# Patient Record
Sex: Female | Born: 1935 | Race: White | Hispanic: No | Marital: Married | Smoking: Never smoker
Health system: Southern US, Community
[De-identification: ages and names within clinical notes are randomized; demographics above are authoritative.]

## PROBLEM LIST (undated history)

## (undated) DIAGNOSIS — T7840XA Allergy, unspecified, initial encounter: Secondary | ICD-10-CM

## (undated) DIAGNOSIS — C50919 Malignant neoplasm of unspecified site of unspecified female breast: Secondary | ICD-10-CM

## (undated) DIAGNOSIS — I10 Essential (primary) hypertension: Secondary | ICD-10-CM

## (undated) DIAGNOSIS — I519 Heart disease, unspecified: Secondary | ICD-10-CM

## (undated) DIAGNOSIS — M1990 Unspecified osteoarthritis, unspecified site: Secondary | ICD-10-CM

## (undated) DIAGNOSIS — C801 Malignant (primary) neoplasm, unspecified: Secondary | ICD-10-CM

## (undated) DIAGNOSIS — R011 Cardiac murmur, unspecified: Secondary | ICD-10-CM

## (undated) DIAGNOSIS — M719 Bursopathy, unspecified: Secondary | ICD-10-CM

## (undated) HISTORY — PX: UPPER GI ENDOSCOPY: SHX6162

## (undated) HISTORY — DX: Heart disease, unspecified: I51.9

## (undated) HISTORY — DX: Essential (primary) hypertension: I10

## (undated) HISTORY — DX: Malignant (primary) neoplasm, unspecified: C80.1

## (undated) HISTORY — DX: Bursopathy, unspecified: M71.9

## (undated) HISTORY — DX: Allergy, unspecified, initial encounter: T78.40XA

## (undated) HISTORY — DX: Unspecified osteoarthritis, unspecified site: M19.90

## (undated) HISTORY — DX: Cardiac murmur, unspecified: R01.1

## (undated) HISTORY — PX: ESOPHAGEAL DILATION: SHX303

## (undated) HISTORY — DX: Malignant neoplasm of unspecified site of unspecified female breast: C50.919

---
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
## 1981-06-09 HISTORY — PX: ABDOMINAL HYSTERECTOMY: SHX81

## 2000-06-09 HISTORY — PX: BRONCHOSCOPY: SUR163

## 2002-06-09 HISTORY — PX: COLONOSCOPY: SHX174

## 2004-03-12 ENCOUNTER — Ambulatory Visit: Payer: Self-pay | Admitting: Otolaryngology

## 2004-06-09 DIAGNOSIS — I10 Essential (primary) hypertension: Secondary | ICD-10-CM

## 2004-06-09 HISTORY — DX: Essential (primary) hypertension: I10

## 2005-04-25 ENCOUNTER — Ambulatory Visit: Payer: Self-pay | Admitting: Internal Medicine

## 2006-05-13 ENCOUNTER — Ambulatory Visit: Payer: Self-pay | Admitting: Internal Medicine

## 2007-01-05 ENCOUNTER — Ambulatory Visit: Payer: Self-pay | Admitting: Otolaryngology

## 2007-03-11 ENCOUNTER — Ambulatory Visit: Payer: Self-pay | Admitting: Internal Medicine

## 2007-05-17 ENCOUNTER — Ambulatory Visit: Payer: Self-pay | Admitting: Internal Medicine

## 2007-10-28 ENCOUNTER — Ambulatory Visit: Payer: Self-pay | Admitting: Otolaryngology

## 2008-05-18 ENCOUNTER — Ambulatory Visit: Payer: Self-pay | Admitting: Internal Medicine

## 2009-05-22 ENCOUNTER — Ambulatory Visit: Payer: Self-pay | Admitting: Internal Medicine

## 2009-05-24 ENCOUNTER — Ambulatory Visit: Payer: Self-pay | Admitting: Internal Medicine

## 2009-06-09 HISTORY — PX: PORT A CATH REVISION: SHX6033

## 2009-06-14 ENCOUNTER — Ambulatory Visit: Payer: Self-pay | Admitting: General Surgery

## 2009-06-19 ENCOUNTER — Ambulatory Visit: Payer: Self-pay | Admitting: General Surgery

## 2009-06-21 ENCOUNTER — Ambulatory Visit: Payer: Self-pay | Admitting: General Surgery

## 2009-06-21 DIAGNOSIS — C50919 Malignant neoplasm of unspecified site of unspecified female breast: Secondary | ICD-10-CM

## 2009-06-21 HISTORY — PX: BREAST SURGERY: SHX581

## 2009-06-21 HISTORY — DX: Malignant neoplasm of unspecified site of unspecified female breast: C50.919

## 2009-07-10 ENCOUNTER — Ambulatory Visit: Payer: Self-pay | Admitting: Oncology

## 2009-07-13 ENCOUNTER — Ambulatory Visit: Payer: Self-pay | Admitting: Oncology

## 2009-07-19 ENCOUNTER — Ambulatory Visit: Payer: Self-pay | Admitting: General Surgery

## 2009-08-02 ENCOUNTER — Inpatient Hospital Stay: Payer: Self-pay | Admitting: Oncology

## 2009-08-07 ENCOUNTER — Ambulatory Visit: Payer: Self-pay | Admitting: Oncology

## 2009-09-07 ENCOUNTER — Ambulatory Visit: Payer: Self-pay | Admitting: Oncology

## 2009-10-07 ENCOUNTER — Ambulatory Visit: Payer: Self-pay | Admitting: Oncology

## 2009-11-07 ENCOUNTER — Ambulatory Visit: Payer: Self-pay | Admitting: Oncology

## 2009-12-07 ENCOUNTER — Ambulatory Visit: Payer: Self-pay | Admitting: Oncology

## 2010-01-07 ENCOUNTER — Ambulatory Visit: Payer: Self-pay | Admitting: Oncology

## 2010-02-14 ENCOUNTER — Ambulatory Visit: Payer: Self-pay | Admitting: Oncology

## 2010-03-09 ENCOUNTER — Ambulatory Visit: Payer: Self-pay | Admitting: Oncology

## 2010-03-29 LAB — CANCER ANTIGEN 27.29: CA 27.29: 26.9 U/mL (ref 0.0–38.6)

## 2010-04-09 ENCOUNTER — Ambulatory Visit: Payer: Self-pay | Admitting: Oncology

## 2010-05-02 ENCOUNTER — Emergency Department: Payer: Self-pay | Admitting: Emergency Medicine

## 2010-05-09 ENCOUNTER — Ambulatory Visit: Payer: Self-pay | Admitting: Oncology

## 2010-06-09 ENCOUNTER — Ambulatory Visit: Payer: Self-pay | Admitting: Oncology

## 2010-06-09 HISTORY — PX: PORT-A-CATH REMOVAL: SHX5289

## 2010-06-09 HISTORY — PX: TRACHEAL SURGERY: SHX1096

## 2010-06-25 ENCOUNTER — Ambulatory Visit: Payer: Self-pay | Admitting: General Surgery

## 2010-07-05 LAB — CANCER ANTIGEN 27.29: CA 27.29: 28.1 U/mL (ref 0.0–38.6)

## 2010-07-10 ENCOUNTER — Ambulatory Visit: Payer: Self-pay | Admitting: Oncology

## 2010-10-25 ENCOUNTER — Ambulatory Visit: Payer: Self-pay | Admitting: Oncology

## 2010-11-08 ENCOUNTER — Ambulatory Visit: Payer: Self-pay | Admitting: Oncology

## 2011-05-06 ENCOUNTER — Ambulatory Visit: Payer: Self-pay | Admitting: Oncology

## 2011-05-10 ENCOUNTER — Ambulatory Visit: Payer: Self-pay | Admitting: Oncology

## 2011-07-01 ENCOUNTER — Ambulatory Visit: Payer: Self-pay | Admitting: General Surgery

## 2011-11-05 ENCOUNTER — Ambulatory Visit: Payer: Self-pay | Admitting: Oncology

## 2011-11-05 LAB — CBC CANCER CENTER
Eosinophil #: 0.1 x10 3/mm (ref 0.0–0.7)
Eosinophil %: 1 %
HGB: 14.5 g/dL (ref 12.0–16.0)
Lymphocyte %: 35.8 %
MCV: 87 fL (ref 80–100)
Monocyte #: 0.5 x10 3/mm (ref 0.2–0.9)
Monocyte %: 7.7 %
Neutrophil #: 3.6 x10 3/mm (ref 1.4–6.5)
Neutrophil %: 54.7 %
Platelet: 174 x10 3/mm (ref 150–440)
RBC: 5.05 10*6/uL (ref 3.80–5.20)
RDW: 14.5 % (ref 11.5–14.5)
WBC: 6.5 x10 3/mm (ref 3.6–11.0)

## 2011-11-05 LAB — COMPREHENSIVE METABOLIC PANEL
Alkaline Phosphatase: 100 U/L (ref 50–136)
Anion Gap: 8 (ref 7–16)
BUN: 18 mg/dL (ref 7–18)
Bilirubin,Total: 0.5 mg/dL (ref 0.2–1.0)
Calcium, Total: 9.4 mg/dL (ref 8.5–10.1)
EGFR (African American): >60
Osmolality: 280 (ref 275–301)
Potassium: 4.2 mmol/L (ref 3.5–5.1)
SGPT (ALT): 28 U/L
Sodium: 139 mmol/L (ref 136–145)

## 2011-11-08 ENCOUNTER — Ambulatory Visit: Payer: Self-pay | Admitting: Oncology

## 2012-05-12 ENCOUNTER — Ambulatory Visit: Payer: Self-pay | Admitting: Oncology

## 2012-05-12 LAB — COMPREHENSIVE METABOLIC PANEL
Albumin: 3.4 g/dL (ref 3.4–5.0)
Alkaline Phosphatase: 95 U/L (ref 50–136)
BUN: 18 mg/dL (ref 7–18)
Calcium, Total: 9.1 mg/dL (ref 8.5–10.1)
EGFR (Non-African Amer.): 52 — ABNORMAL LOW
Glucose: 96 mg/dL (ref 65–99)
Osmolality: 277 (ref 275–301)
SGOT(AST): 19 U/L (ref 15–37)
SGPT (ALT): 22 U/L (ref 12–78)
Sodium: 138 mmol/L (ref 136–145)
Total Protein: 7.2 g/dL (ref 6.4–8.2)

## 2012-05-12 LAB — CBC CANCER CENTER
Basophil %: 0.2 %
Eosinophil %: 1.2 %
HGB: 15.4 g/dL (ref 12.0–16.0)
MCH: 29.2 pg (ref 26.0–34.0)
MCHC: 33.6 g/dL (ref 32.0–36.0)
Monocyte %: 8.2 %
Neutrophil %: 56.2 %
Platelet: 170 x10 3/mm (ref 150–440)
WBC: 6.1 x10 3/mm (ref 3.6–11.0)

## 2012-05-13 LAB — CANCER ANTIGEN 27.29: CA 27.29: 29.1 U/mL (ref 0.0–38.6)

## 2012-06-09 ENCOUNTER — Ambulatory Visit: Payer: Self-pay | Admitting: Oncology

## 2012-07-01 ENCOUNTER — Ambulatory Visit: Payer: Self-pay | Admitting: General Surgery

## 2012-11-10 ENCOUNTER — Ambulatory Visit: Payer: Self-pay | Admitting: Oncology

## 2012-12-07 ENCOUNTER — Encounter: Payer: Self-pay | Admitting: *Deleted

## 2012-12-07 ENCOUNTER — Ambulatory Visit: Payer: Self-pay | Admitting: Oncology

## 2013-05-11 ENCOUNTER — Ambulatory Visit: Payer: Self-pay | Admitting: Oncology

## 2013-05-11 LAB — CBC CANCER CENTER
Basophil %: 1.1 %
Eosinophil %: 0.8 %
HCT: 43.2 % (ref 35.0–47.0)
HGB: 14.3 g/dL (ref 12.0–16.0)
Lymphocyte #: 3.2 x10 3/mm (ref 1.0–3.6)
Lymphocyte %: 41.6 %
MCH: 29 pg (ref 26.0–34.0)
MCHC: 33.1 g/dL (ref 32.0–36.0)
Monocyte %: 4 %
Neutrophil #: 4 x10 3/mm (ref 1.4–6.5)
Neutrophil %: 52.5 %
Platelet: 171 x10 3/mm (ref 150–440)
RBC: 4.94 10*6/uL (ref 3.80–5.20)
RDW: 14.1 % (ref 11.5–14.5)
WBC: 7.7 x10 3/mm (ref 3.6–11.0)

## 2013-05-11 LAB — COMPREHENSIVE METABOLIC PANEL
Albumin: 3.4 g/dL (ref 3.4–5.0)
BUN: 18 mg/dL (ref 7–18)
EGFR (African American): >60
EGFR (Non-African Amer.): 54 — ABNORMAL LOW
Glucose: 134 mg/dL — ABNORMAL HIGH (ref 65–99)
SGOT(AST): 20 U/L (ref 15–37)
SGPT (ALT): 19 U/L (ref 12–78)
Total Protein: 7.2 g/dL (ref 6.4–8.2)

## 2013-05-12 LAB — CANCER ANTIGEN 27.29: CA 27.29: 38.8 U/mL — ABNORMAL HIGH (ref 0.0–38.6)

## 2013-06-09 ENCOUNTER — Ambulatory Visit: Payer: Self-pay | Admitting: Oncology

## 2013-07-11 ENCOUNTER — Ambulatory Visit: Payer: Self-pay | Admitting: General Surgery

## 2013-07-12 ENCOUNTER — Encounter: Payer: Self-pay | Admitting: General Surgery

## 2013-07-20 ENCOUNTER — Encounter: Payer: Self-pay | Admitting: General Surgery

## 2013-07-20 ENCOUNTER — Ambulatory Visit (INDEPENDENT_AMBULATORY_CARE_PROVIDER_SITE_OTHER): Payer: Medicare Other | Admitting: General Surgery

## 2013-07-20 VITALS — BP 142/66 | HR 68 | Resp 14 | Ht 64.0 in | Wt 130.0 lb

## 2013-07-20 DIAGNOSIS — C50919 Malignant neoplasm of unspecified site of unspecified female breast: Secondary | ICD-10-CM

## 2013-07-20 NOTE — Patient Instructions (Signed)
Continue to do self breast exams. Call for any new breast issues or concerns. 

## 2013-07-20 NOTE — Progress Notes (Addendum)
Patient ID: Diana Ellis, female   DOB: 1934-05-30, 80 y.o.   MRN: [id number]  Chief Complaint  Patient presents with  . Follow-up    history of breast cancer    HPI Diana Ellis is a 80 y.o. female.  who presents for her annual follow up and breast evaluation. She has a known history of left breast cancer 2011.The most recent mammogram was done on 07-13-13.  Patient does perform regular self breast checks and gets regular mammograms done.  No new breast issues, tolerating Tamoxifen she does admit to "hot flashes".  HPI  Past Medical History  Diagnosis Date  . Allergy   . Hypertension 2006  . Arthritis   . Bursitis   . Cardiac disease   . Heart murmur   . Cancer   . Malignant neoplasm of breast (female), unspecified site June 21, 2009    T2, N1a, M0; ER 25%, PR 50%, HER-2/neu not over expressed. Mammoprint: High risk for recurrent.    Past Surgical History  Procedure Laterality Date  . Colonoscopy  2004    Dr Vira Agar  . Tracheal surgery  2012  . Port a cath revision  2011  . Upper gi endoscopy    . Abdominal hysterectomy  1983  . Bronchoscopy  2002  . Port-a-cath removal  2012  . Tonsillectomy  1957  . Breast surgery Left June 21, 2009    MASTECTOMY,, sentinel node biopsy    Family History  Problem Relation Age of Onset  . Cancer Mother     lung    Social History History  Substance Use Topics  . Smoking status: Never Smoker   . Smokeless tobacco: Never Used  . Alcohol Use: No    Allergies  Allergen Reactions  . Codeine Nausea Only    Current Outpatient Prescriptions  Medication Sig Dispense Refill  . acetaminophen (TYLENOL ARTHRITIS PAIN) 650 MG CR tablet Take 650 mg by mouth 2 (two) times daily.      Marland Kitchen aspirin 325 MG tablet Take 325 mg by mouth daily. 1/2 tablet daily      . calcium carbonate (OS-CAL) 600 MG TABS tablet Take 600 mg by mouth daily with breakfast.      . gabapentin (NEURONTIN) 300 MG capsule Take 300 mg by mouth 3 (three) times daily.       . Glucosamine-Chondroit-Vit C-Mn (GLUCOSAMINE 1500 COMPLEX) CAPS Take by mouth daily.      Marland Kitchen KRILL OIL PO Take by mouth daily.      Marland Kitchen omeprazole (PRILOSEC) 20 MG capsule Take 20 mg by mouth daily.      . tamoxifen (NOLVADEX) 20 MG tablet Take 20 mg by mouth daily.      Marland Kitchen triamterene-hydrochlorothiazide (MAXZIDE-25) 37.5-25 MG per tablet Take 1 tablet by mouth daily.       No current facility-administered medications for this visit.    Review of Systems Review of Systems  Constitutional: Negative.   Respiratory: Negative.   Cardiovascular: Negative.     Blood pressure 142/66, pulse 68, resp. rate 14, height 5' 4"  (1.626 m), weight 130 lb (58.968 kg).  Physical Exam Physical Exam  Constitutional: She is oriented to person, place, and time. She appears well-developed and well-nourished.  Neck: Neck supple.  Cardiovascular: Normal rate, regular rhythm and normal heart sounds.   No lower leg edema noted  Pulmonary/Chest: Effort normal and breath sounds normal. Right breast exhibits no inverted nipple, no mass, no nipple discharge, no skin change and no tenderness.  Left chest wall with no abnormalities   Lymphadenopathy:    She has no cervical adenopathy.    She has no axillary adenopathy.  Neurological: She is alert and oriented to person, place, and time.  Skin: Skin is warm and dry.    Data Reviewed Mammogram dated July 11, 2013 of the right breast was unremarkable. BI-RAD-1.  Assessment    Doing well status post left mastectomy sentinel node biopsy for invasive lobular carcinoma, status post adjuvant chemotherapy.     Plan    Will plan for a follow up examination in one year with a right screening mammogram.   A prescription for a new left breast prosthesis and mastectomy bras x 6 was provided.       Robert Bellow 07/21/2013, 9:15 PM

## 2013-07-21 ENCOUNTER — Encounter: Payer: Self-pay | Admitting: General Surgery

## 2013-07-21 DIAGNOSIS — C50919 Malignant neoplasm of unspecified site of unspecified female breast: Secondary | ICD-10-CM | POA: Insufficient documentation

## 2013-08-03 ENCOUNTER — Encounter: Payer: Self-pay | Admitting: General Surgery

## 2013-09-19 ENCOUNTER — Ambulatory Visit: Payer: Self-pay | Admitting: Anesthesiology

## 2013-09-19 LAB — POTASSIUM: Potassium: 3.7 mmol/L (ref 3.5–5.1)

## 2013-10-18 ENCOUNTER — Ambulatory Visit: Payer: Self-pay | Admitting: Obstetrics and Gynecology

## 2013-10-18 LAB — HEMOGLOBIN: HGB: 14 g/dL (ref 12.0–16.0)

## 2013-10-18 LAB — BASIC METABOLIC PANEL
Anion Gap: 4 — ABNORMAL LOW (ref 7–16)
BUN: 17 mg/dL (ref 7–18)
CALCIUM: 8.9 mg/dL (ref 8.5–10.1)
Chloride: 105 mmol/L (ref 98–107)
Co2: 30 mmol/L (ref 21–32)
Creatinine: 0.86 mg/dL (ref 0.60–1.30)
EGFR (African American): >60
Glucose: 98 mg/dL (ref 65–99)
Osmolality: 279 (ref 275–301)
POTASSIUM: 3.5 mmol/L (ref 3.5–5.1)
Sodium: 139 mmol/L (ref 136–145)

## 2013-10-24 ENCOUNTER — Ambulatory Visit: Payer: Self-pay | Admitting: Obstetrics and Gynecology

## 2013-10-25 LAB — BASIC METABOLIC PANEL
Anion Gap: 5 — ABNORMAL LOW (ref 7–16)
BUN: 10 mg/dL (ref 7–18)
CALCIUM: 8.2 mg/dL — AB (ref 8.5–10.1)
Chloride: 102 mmol/L (ref 98–107)
Co2: 31 mmol/L (ref 21–32)
Creatinine: 0.88 mg/dL (ref 0.60–1.30)
EGFR (African American): >60
EGFR (Non-African Amer.): >60
Glucose: 102 mg/dL — ABNORMAL HIGH (ref 65–99)
OSMOLALITY: 275 (ref 275–301)
POTASSIUM: 3.3 mmol/L — AB (ref 3.5–5.1)
Sodium: 138 mmol/L (ref 136–145)

## 2013-10-25 LAB — HEMATOCRIT: HCT: 37 % (ref 35.0–47.0)

## 2013-11-15 ENCOUNTER — Ambulatory Visit: Payer: Self-pay | Admitting: Oncology

## 2013-11-15 LAB — COMPREHENSIVE METABOLIC PANEL
ALBUMIN: 3 g/dL — AB (ref 3.4–5.0)
ALT: 14 U/L (ref 12–78)
ANION GAP: 8 (ref 7–16)
Alkaline Phosphatase: 76 U/L
BUN: 19 mg/dL — ABNORMAL HIGH (ref 7–18)
Bilirubin,Total: 0.3 mg/dL (ref 0.2–1.0)
CHLORIDE: 101 mmol/L (ref 98–107)
CO2: 29 mmol/L (ref 21–32)
CREATININE: 1 mg/dL (ref 0.60–1.30)
Calcium, Total: 8.8 mg/dL (ref 8.5–10.1)
EGFR (African American): >60
GFR CALC NON AF AMER: 54 — AB
Glucose: 121 mg/dL — ABNORMAL HIGH (ref 65–99)
Osmolality: 279 (ref 275–301)
POTASSIUM: 3.4 mmol/L — AB (ref 3.5–5.1)
SGOT(AST): 18 U/L (ref 15–37)
SODIUM: 138 mmol/L (ref 136–145)
TOTAL PROTEIN: 6.8 g/dL (ref 6.4–8.2)

## 2013-11-15 LAB — CBC CANCER CENTER
BASOS PCT: 1.2 %
Basophil #: 0.1 x10 3/mm (ref 0.0–0.1)
Eosinophil #: 0.2 x10 3/mm (ref 0.0–0.7)
Eosinophil %: 2.7 %
HCT: 38.7 % (ref 35.0–47.0)
HGB: 13 g/dL (ref 12.0–16.0)
LYMPHS PCT: 34.8 %
Lymphocyte #: 2.6 x10 3/mm (ref 1.0–3.6)
MCH: 28.9 pg (ref 26.0–34.0)
MCHC: 33.6 g/dL (ref 32.0–36.0)
MCV: 86 fL (ref 80–100)
Monocyte #: 0.4 x10 3/mm (ref 0.2–0.9)
Monocyte %: 5.6 %
Neutrophil #: 4.2 x10 3/mm (ref 1.4–6.5)
Neutrophil %: 55.7 %
Platelet: 181 x10 3/mm (ref 150–440)
RBC: 4.5 10*6/uL (ref 3.80–5.20)
RDW: 14.8 % — ABNORMAL HIGH (ref 11.5–14.5)
WBC: 7.5 x10 3/mm (ref 3.6–11.0)

## 2013-12-07 ENCOUNTER — Ambulatory Visit: Payer: Self-pay | Admitting: Oncology

## 2014-04-10 ENCOUNTER — Encounter: Payer: Self-pay | Admitting: General Surgery

## 2014-05-17 ENCOUNTER — Ambulatory Visit: Payer: Self-pay | Admitting: Oncology

## 2014-05-17 LAB — CBC CANCER CENTER
BASOS ABS: 0.1 x10 3/mm (ref 0.0–0.1)
BASOS PCT: 1.1 %
EOS ABS: 0.1 x10 3/mm (ref 0.0–0.7)
EOS PCT: 0.9 %
HCT: 39.2 % (ref 35.0–47.0)
HGB: 12.9 g/dL (ref 12.0–16.0)
LYMPHS PCT: 48.4 %
Lymphocyte #: 3.4 x10 3/mm (ref 1.0–3.6)
MCH: 28.1 pg (ref 26.0–34.0)
MCHC: 32.9 g/dL (ref 32.0–36.0)
MCV: 85 fL (ref 80–100)
MONOS PCT: 6.1 %
Monocyte #: 0.4 x10 3/mm (ref 0.2–0.9)
NEUTROS PCT: 43.5 %
Neutrophil #: 3 x10 3/mm (ref 1.4–6.5)
Platelet: 203 x10 3/mm (ref 150–440)
RBC: 4.59 10*6/uL (ref 3.80–5.20)
RDW: 15.3 % — ABNORMAL HIGH (ref 11.5–14.5)
WBC: 7 x10 3/mm (ref 3.6–11.0)

## 2014-05-17 LAB — COMPREHENSIVE METABOLIC PANEL
ALBUMIN: 3.1 g/dL — AB (ref 3.4–5.0)
ALK PHOS: 88 U/L
ANION GAP: 8 (ref 7–16)
AST: 32 U/L (ref 15–37)
BILIRUBIN TOTAL: 0.3 mg/dL (ref 0.2–1.0)
BUN: 21 mg/dL — ABNORMAL HIGH (ref 7–18)
CHLORIDE: 101 mmol/L (ref 98–107)
CREATININE: 1.19 mg/dL (ref 0.60–1.30)
Calcium, Total: 8.8 mg/dL (ref 8.5–10.1)
Co2: 28 mmol/L (ref 21–32)
EGFR (Non-African Amer.): 47 — ABNORMAL LOW
GFR CALC AF AMER: 57 — AB
Glucose: 149 mg/dL — ABNORMAL HIGH (ref 65–99)
OSMOLALITY: 280 (ref 275–301)
Potassium: 3.6 mmol/L (ref 3.5–5.1)
SGPT (ALT): 15 U/L
Sodium: 137 mmol/L (ref 136–145)
Total Protein: 7 g/dL (ref 6.4–8.2)

## 2014-06-09 ENCOUNTER — Ambulatory Visit: Payer: Self-pay | Admitting: Oncology

## 2014-07-12 ENCOUNTER — Encounter: Payer: Self-pay | Admitting: General Surgery

## 2014-07-12 ENCOUNTER — Ambulatory Visit: Payer: Self-pay | Admitting: General Surgery

## 2014-07-20 ENCOUNTER — Ambulatory Visit: Payer: PRIVATE HEALTH INSURANCE | Admitting: General Surgery

## 2014-07-28 ENCOUNTER — Ambulatory Visit: Payer: Self-pay | Admitting: Unknown Physician Specialty

## 2014-08-01 ENCOUNTER — Encounter: Payer: Self-pay | Admitting: General Surgery

## 2014-08-01 ENCOUNTER — Ambulatory Visit (INDEPENDENT_AMBULATORY_CARE_PROVIDER_SITE_OTHER): Payer: Medicare Other | Admitting: General Surgery

## 2014-08-01 VITALS — BP 118/70 | HR 68 | Resp 12 | Ht 64.0 in | Wt 110.0 lb

## 2014-08-01 DIAGNOSIS — C50912 Malignant neoplasm of unspecified site of left female breast: Secondary | ICD-10-CM

## 2014-08-01 DIAGNOSIS — R1314 Dysphagia, pharyngoesophageal phase: Secondary | ICD-10-CM | POA: Diagnosis not present

## 2014-08-01 DIAGNOSIS — R634 Abnormal weight loss: Secondary | ICD-10-CM | POA: Diagnosis not present

## 2014-08-01 NOTE — Progress Notes (Signed)
Patient ID: Diana Ellis, female   DOB: 1933-11-04, 81 y.o.   MRN: [id number]  Chief Complaint  Patient presents with  . Follow-up    mammogram    HPI Diana Ellis is a 81 y.o. female who presents for a breast evaluation. The most recent right breast  mammogram was done on 07/12/14.  Patient does perform regular self breast checks and gets regular mammograms done.  Patient had a Esophageal dilation done on 07/28/14. She lost 20 pound over a two month period. Due to appetite changes. She is scheduled for a pet scan on 08/03/14 at [place].  HPI  Past Medical History  Diagnosis Date  . Allergy   . Hypertension 2006  . Arthritis   . Bursitis   . Cardiac disease   . Heart murmur   . Cancer   . Malignant neoplasm of breast (female), unspecified site June 21, 2009    T2, N1a, M0; ER 25%, PR 50%, HER-2/neu not over expressed. Mammoprint: High risk for recurrent. Oncology Protocol: Patient is on Research Protocol E 5103 and will receive AC every 3 weeks x 4, then Taxol weekly x 12 treatments and Avastin vs Placebo through the [facility] and Taxol portion of trial.    Past Surgical History  Procedure Laterality Date  . Colonoscopy  2004    Dr Vira Agar  . Tracheal surgery  2012  . Port a cath revision  2011  . Upper gi endoscopy    . Abdominal hysterectomy  1983  . Bronchoscopy  2002  . Port-a-cath removal  2012  . Tonsillectomy  1957  . Breast surgery Left June 21, 2009    MASTECTOMY,, sentinel node biopsy  . Esophageal dilation  07/28/14.     Dr. Vira Agar    Family History  Problem Relation Age of Onset  . Cancer Mother     lung    Social History History  Substance Use Topics  . Smoking status: Never Smoker   . Smokeless tobacco: Never Used  . Alcohol Use: No    Allergies  Allergen Reactions  . Codeine Nausea Only    Current Outpatient Prescriptions  Medication Sig Dispense Refill  . acetaminophen (TYLENOL ARTHRITIS PAIN) 650 MG CR tablet Take 650 mg by mouth 2 (two)  times daily.    Marland Kitchen aspirin 325 MG tablet Take 325 mg by mouth daily. 1/2 tablet daily    . calcium carbonate (OS-CAL) 600 MG TABS tablet Take 600 mg by mouth daily with breakfast.    . cholecalciferol (VITAMIN D) 1000 UNITS tablet Take 1,000 Units by mouth daily.    Marland Kitchen gabapentin (NEURONTIN) 300 MG capsule Take 300 mg by mouth 3 (three) times daily.    . Glucosamine-Chondroit-Vit C-Mn (GLUCOSAMINE 1500 COMPLEX) CAPS Take by mouth daily.    Marland Kitchen KRILL OIL PO Take by mouth daily.    . metoprolol succinate (TOPROL-XL) 25 MG 24 hr tablet Take 1 tablet by mouth.    . ranitidine (ZANTAC) 150 MG capsule Take 150 mg by mouth 2 (two) times daily.    . tamoxifen (NOLVADEX) 20 MG tablet Take 20 mg by mouth daily.    Marland Kitchen triamterene-hydrochlorothiazide (MAXZIDE-25) 37.5-25 MG per tablet Take 1 tablet by mouth daily.     No current facility-administered medications for this visit.    Review of Systems Review of Systems  Respiratory: Negative.   Cardiovascular: Negative.     Blood pressure 118/70, pulse 68, resp. rate 12, height 5' 4"  (1.626 m), weight 110 lb (  49.896 kg). The patient weighed 130 pounds at the time of her February 2015 exam.  Physical Exam Physical Exam  Constitutional: She is oriented to person, place, and time. She appears well-developed and well-nourished.  Eyes: Conjunctivae are normal. No scleral icterus.  Neck: Neck supple.  Cardiovascular: Normal rate, regular rhythm and normal heart sounds.   Pulmonary/Chest: Effort normal and breath sounds normal. Right breast exhibits no inverted nipple, no mass, no nipple discharge, no skin change and no tenderness.  Left mastectomy site is well healed .   Abdominal: Soft. Normal appearance and bowel sounds are normal. There is no hepatomegaly. There is no tenderness.  Lymphadenopathy:    She has no cervical adenopathy.  Neurological: She is alert and oriented to person, place, and time.  Skin: Skin is warm and dry.    Data  Reviewed Upper endoscopy dated 07/28/2014 completed by Gaylyn Cheers showed a moderate Schatzki ring which was dilated to 17 mm. Gastric biopsies were negative for H. pylori or malignancy. Endoscopy results reviewed with Dr. Vira Agar.  Chest x-ray in 07/27/2014 suggested a nodular density overlying the right mid lung for which a CT scan was recommended (see scheduled for 08/03/2014.  Assessment    Significant weight loss secondary to dysphasia. Questionable pulmonary nodule.  No evidence of recurrent breast cancer on clinical exam.    Plan    The patient has been asked to return to the office in one year with a right screening mammogram.    PCP:  Timoteo Gaul 08/02/2014, 9:02 PM

## 2014-08-01 NOTE — Patient Instructions (Addendum)
The patient has been asked to return to the office in one year with a right screening mammogram.

## 2014-08-02 ENCOUNTER — Encounter: Payer: Self-pay | Admitting: General Surgery

## 2014-08-02 DIAGNOSIS — R1314 Dysphagia, pharyngoesophageal phase: Secondary | ICD-10-CM | POA: Insufficient documentation

## 2014-08-02 DIAGNOSIS — R634 Abnormal weight loss: Secondary | ICD-10-CM | POA: Insufficient documentation

## 2014-08-03 ENCOUNTER — Telehealth: Payer: Self-pay | Admitting: General Surgery

## 2014-08-03 ENCOUNTER — Ambulatory Visit: Payer: Self-pay | Admitting: Unknown Physician Specialty

## 2014-08-03 NOTE — Telephone Encounter (Signed)
RX ready

## 2014-08-03 NOTE — Telephone Encounter (Signed)
PATIENT HAS CALLED TODAY & WOULD LIKE A SCRIPT FOR BRAS.SHE WILL BE IN AREA AROUND 11:00 THIS MORNING.PLEASE CALL WHEN SCRIIPT HAS BEEN COMPLETED FOR HER TO PICK UP./MTH

## 2014-08-14 ENCOUNTER — Ambulatory Visit
Admit: 2014-08-14 | Disposition: A | Discharge status: Home / Self Care | Payer: Self-pay | Attending: Oncology | Admitting: Oncology

## 2014-08-16 ENCOUNTER — Ambulatory Visit: Payer: Self-pay | Admitting: Oncology

## 2014-09-05 LAB — CBC CANCER CENTER
BASOS PCT: 0.1 %
Basophil #: 0 x10 3/mm (ref 0.0–0.1)
EOS ABS: 0 x10 3/mm (ref 0.0–0.7)
EOS PCT: 0.1 %
HCT: 26.7 % — AB (ref 35.0–47.0)
HGB: 8.9 g/dL — AB (ref 12.0–16.0)
Lymphocyte #: 0.8 x10 3/mm — ABNORMAL LOW (ref 1.0–3.6)
Lymphocyte %: 10.9 %
MCH: 29.3 pg (ref 26.0–34.0)
MCHC: 33.4 g/dL (ref 32.0–36.0)
MCV: 88 fL (ref 80–100)
MONO ABS: 0 x10 3/mm — AB (ref 0.2–0.9)
MONOS PCT: 0.6 %
Neutrophil #: 6.3 x10 3/mm (ref 1.4–6.5)
Neutrophil %: 88.3 %
Platelet: 234 x10 3/mm (ref 150–440)
RBC: 3.05 10*6/uL — AB (ref 3.80–5.20)
RDW: 18.5 % — ABNORMAL HIGH (ref 11.5–14.5)
WBC: 7.1 x10 3/mm (ref 3.6–11.0)

## 2014-09-08 ENCOUNTER — Ambulatory Visit
Admit: 2014-09-08 | Disposition: A | Discharge status: Home / Self Care | Payer: Self-pay | Attending: Oncology | Admitting: Oncology

## 2014-09-12 LAB — CBC CANCER CENTER
Basophil #: 0 "x10 3/mm "
Basophil %: 0.1 %
Eosinophil #: 0 "x10 3/mm "
Eosinophil %: 0.1 %
HCT: 27.2 % — ABNORMAL LOW
HGB: 9 g/dL — ABNORMAL LOW
Lymphocyte %: 28.3 %
Lymphs Abs: 0.6 "x10 3/mm " — ABNORMAL LOW
MCH: 29.7 pg
MCHC: 33.3 g/dL
MCV: 89 fL
Monocyte #: 0 "x10 3/mm " — ABNORMAL LOW
Monocyte %: 1.3 %
Neutrophil #: 1.6 "x10 3/mm "
Neutrophil %: 70.2 %
Platelet: 100 "x10 3/mm " — ABNORMAL LOW
RBC: 3.05 "x10 6/mm " — ABNORMAL LOW
RDW: 18.5 % — ABNORMAL HIGH
WBC: 2.2 "x10 3/mm " — ABNORMAL LOW

## 2014-09-12 LAB — BASIC METABOLIC PANEL WITH GFR
Anion Gap: 6 — ABNORMAL LOW
BUN: 32 mg/dL — ABNORMAL HIGH
Calcium, Total: 8.9 mg/dL
Chloride: 101 mmol/L
Co2: 23 mmol/L
Creatinine: 1.38 mg/dL — ABNORMAL HIGH
EGFR (African American): 42 — ABNORMAL LOW
EGFR (Non-African Amer.): 37 — ABNORMAL LOW
Glucose: 179 mg/dL — ABNORMAL HIGH
Potassium: 4.6 mmol/L
Sodium: 130 mmol/L — ABNORMAL LOW

## 2014-09-19 LAB — COMPREHENSIVE METABOLIC PANEL
ALBUMIN: 3.2 g/dL — AB
ALK PHOS: 53 U/L
ALT: 16 U/L
ANION GAP: 8 (ref 7–16)
AST: 30 U/L
BUN: 26 mg/dL — ABNORMAL HIGH
Bilirubin,Total: 0.4 mg/dL
CALCIUM: 8.8 mg/dL — AB
CO2: 25 mmol/L
Chloride: 102 mmol/L
Creatinine: 1.41 mg/dL — ABNORMAL HIGH
EGFR (African American): 41 — ABNORMAL LOW
EGFR (Non-African Amer.): 36 — ABNORMAL LOW
Glucose: 154 mg/dL — ABNORMAL HIGH
Potassium: 4 mmol/L
Sodium: 135 mmol/L
Total Protein: 6.2 g/dL — ABNORMAL LOW

## 2014-09-19 LAB — CBC CANCER CENTER
BASOS PCT: 0.8 %
Basophil #: 0 x10 3/mm (ref 0.0–0.1)
Eosinophil #: 0 x10 3/mm (ref 0.0–0.7)
Eosinophil %: 0.8 %
HCT: 27.4 % — AB (ref 35.0–47.0)
HGB: 9 g/dL — ABNORMAL LOW (ref 12.0–16.0)
Lymphocyte #: 1.2 x10 3/mm (ref 1.0–3.6)
Lymphocyte %: 57.2 %
MCH: 30.3 pg (ref 26.0–34.0)
MCHC: 32.7 g/dL (ref 32.0–36.0)
MCV: 93 fL (ref 80–100)
MONO ABS: 0 x10 3/mm — AB (ref 0.2–0.9)
MONOS PCT: 1.2 %
NEUTROS ABS: 0.8 x10 3/mm — AB (ref 1.4–6.5)
Neutrophil %: 40 %
Platelet: 34 x10 3/mm — ABNORMAL LOW (ref 150–440)
RBC: 2.96 10*6/uL — AB (ref 3.80–5.20)
RDW: 21.2 % — ABNORMAL HIGH (ref 11.5–14.5)
WBC: 2.1 x10 3/mm — AB (ref 3.6–11.0)

## 2014-09-20 LAB — CANCER ANTIGEN 27.29: CA 27.29: 588.1 U/mL — ABNORMAL HIGH (ref 0.0–38.6)

## 2014-09-30 NOTE — Op Note (Signed)
PATIENT NAME:  Diana Ellis, Diana Ellis MR#:  [id number] DATE OF BIRTH:  1933/10/22  DATE OF PROCEDURE:  10/24/2013  PREOPERATIVE DIAGNOSIS: Pelvic relaxation.   POSTOPERATIVE DIAGNOSIS: Pelvic relaxation.   PROCEDURE: Anterior and posterior colporrhaphy.   SURGEON: Boykin Nearing, M.D.   ANESTHESIA: Spinal anesthesia.   INDICATIONS: A 81 year old gravida 2, para 2 patient with third-degree cystocele and second-degree rectocele who desires a surgical intervention.   DESCRIPTION OF PROCEDURE: After the patient was administered spinal anesthesia, the patient was placed in the dorsal supine position with the legs in candy cane stirrups. Lower abdomen, perineum, and vagina were prepped with Betadine. The patient received 2 grams IV cefoxitin prior to commencement of the case. Weighted speculum was placed in the posterior vaginal vault and the vaginal vault at the apex was grasped with two Allis clamps. The central portion of the anterior vaginal epithelium was injected with 1% lidocaine with 1:100,000 epinephrine. Incision was made at the apex and Metzenbaum scissors used to open the vaginal epithelium anteriorly proximally to 1 cm from the urethra meatus. The vaginal epithelium was then dissected laterally with the endopelvic fascia removed from the vaginal epithelium. Bleeding sites were controlled with the Bovie. Once the vaginal epithelium was dissected free from the endopelvic fascia, healthy lateral endopelvic fascia was grasped and sutured with a 2-0 Vicryl suture in a mattress suture, interrupted sutures were used with good repair of the defect. Vaginal epithelium was then trimmed and reapproximated with 0 Vicryl suture in a running locking fashion. Attention was directed to the posterior vaginal epithelium and the perineum. A triangle shape portion of the tissue was removed from the vaginal hymen into the perineal body after being injected with 1% lidocaine with 1:100,000 epinephrine. The posterior  vaginal epithelium was also injected and opened in a central fashion. The vaginal epithelium was dissected free from the endopelvic fascia. Rectocele defect was reduced and mattress 2-0 Vicryl sutures were used to reapproximate healthy lateral endopelvic fascia essentially. Defect was reduced and the vaginal epithelium was trimmed and the vaginal epithelium was then reapproximated with 0 Vicryl suture. The perineal body was closed with a crown suture of 2-0 Vicryl. The skin was reapproximated with 3-0 running subcuticular suture. Good hemostasis was noted. The vagina was packed with Kerlix roll with AVC cream and Foley was placed yielding clear urine. Of note, the patient's bladder was drained initially prior to commencement of the case yielding 75 mL clear urine.   ESTIMATED BLOOD LOSS: 75 mL.   INTRAOPERATIVE FLUIDS: 900 mL.   There were no complications. The patient tolerated the procedure well and was taken to the recovery room in good condition.   ____________________________ Boykin Nearing, MD tjs:sg D: 10/24/2013 11:29:09 ET T: 10/24/2013 12:02:24 ET JOB#: [id number]  cc: Boykin Nearing, MD, <Dictator> Boykin Nearing MD ELECTRONICALLY SIGNED 10/28/2013 8:58

## 2014-10-02 LAB — SURGICAL PATHOLOGY

## 2014-10-03 LAB — CBC CANCER CENTER
Basophil #: 0 x10 3/mm (ref 0.0–0.1)
Basophil %: 2.9 %
EOS PCT: 0.9 %
Eosinophil #: 0 x10 3/mm (ref 0.0–0.7)
HCT: 27.5 % — ABNORMAL LOW (ref 35.0–47.0)
HGB: 9.1 g/dL — ABNORMAL LOW (ref 12.0–16.0)
LYMPHS ABS: 0.8 x10 3/mm — AB (ref 1.0–3.6)
Lymphocyte %: 46.5 %
MCH: 30.7 pg (ref 26.0–34.0)
MCHC: 33.1 g/dL (ref 32.0–36.0)
MCV: 93 fL (ref 80–100)
MONO ABS: 0.1 x10 3/mm — AB (ref 0.2–0.9)
Monocyte %: 4.1 %
NEUTROS PCT: 45.6 %
Neutrophil #: 0.8 x10 3/mm — ABNORMAL LOW (ref 1.4–6.5)
PLATELETS: 167 x10 3/mm (ref 150–440)
RBC: 2.97 10*6/uL — AB (ref 3.80–5.20)
RDW: 24.8 % — ABNORMAL HIGH (ref 11.5–14.5)
WBC: 1.7 x10 3/mm — CL (ref 3.6–11.0)

## 2014-10-03 LAB — BASIC METABOLIC PANEL
ANION GAP: 7 (ref 7–16)
BUN: 19 mg/dL
CALCIUM: 8.7 mg/dL — AB
Chloride: 100 mmol/L — ABNORMAL LOW
Co2: 25 mmol/L
Creatinine: 0.98 mg/dL
EGFR (African American): >60
EGFR (Non-African Amer.): 55 — ABNORMAL LOW
GLUCOSE: 138 mg/dL — AB
Potassium: 4.4 mmol/L
Sodium: 132 mmol/L — ABNORMAL LOW

## 2014-10-03 LAB — MAGNESIUM: Magnesium: 2.2 mg/dL

## 2014-10-04 ENCOUNTER — Other Ambulatory Visit: Payer: Self-pay | Admitting: *Deleted

## 2014-10-04 DIAGNOSIS — C50919 Malignant neoplasm of unspecified site of unspecified female breast: Secondary | ICD-10-CM

## 2014-10-04 DIAGNOSIS — C7951 Secondary malignant neoplasm of bone: Principal | ICD-10-CM

## 2014-10-06 ENCOUNTER — Other Ambulatory Visit: Payer: Self-pay | Admitting: *Deleted

## 2014-10-08 ENCOUNTER — Encounter (HOSPITAL_COMMUNITY): Payer: Self-pay | Admitting: Radiology

## 2014-10-08 ENCOUNTER — Emergency Department: Payer: Medicare Other

## 2014-10-08 ENCOUNTER — Inpatient Hospital Stay (HOSPITAL_COMMUNITY): Payer: Medicare Other

## 2014-10-08 ENCOUNTER — Inpatient Hospital Stay (HOSPITAL_COMMUNITY)
Admission: EM | Admit: 2014-10-08 | Discharge: 2014-10-12 | DRG: 064 | Disposition: A | Discharge status: Skilled Nursing Facility | Payer: Medicare Other | Attending: Internal Medicine | Admitting: Internal Medicine

## 2014-10-08 ENCOUNTER — Emergency Department
Admission: EM | Admit: 2014-10-08 | Discharge: 2014-10-08 | DRG: 064 | Disposition: A | Discharge status: Skilled Nursing Facility | Payer: Medicare Other | Attending: Internal Medicine | Admitting: Internal Medicine

## 2014-10-08 DIAGNOSIS — I6789 Other cerebrovascular disease: Secondary | ICD-10-CM | POA: Diagnosis not present

## 2014-10-08 DIAGNOSIS — R1314 Dysphagia, pharyngoesophageal phase: Secondary | ICD-10-CM | POA: Diagnosis present

## 2014-10-08 DIAGNOSIS — I341 Nonrheumatic mitral (valve) prolapse: Secondary | ICD-10-CM | POA: Diagnosis present

## 2014-10-08 DIAGNOSIS — Z885 Allergy status to narcotic agent status: Secondary | ICD-10-CM | POA: Diagnosis not present

## 2014-10-08 DIAGNOSIS — G936 Cerebral edema: Secondary | ICD-10-CM | POA: Diagnosis present

## 2014-10-08 DIAGNOSIS — C50912 Malignant neoplasm of unspecified site of left female breast: Secondary | ICD-10-CM | POA: Diagnosis not present

## 2014-10-08 DIAGNOSIS — R4781 Slurred speech: Secondary | ICD-10-CM | POA: Diagnosis present

## 2014-10-08 DIAGNOSIS — I10 Essential (primary) hypertension: Secondary | ICD-10-CM | POA: Diagnosis present

## 2014-10-08 DIAGNOSIS — J386 Stenosis of larynx: Secondary | ICD-10-CM | POA: Diagnosis present

## 2014-10-08 DIAGNOSIS — T451X5A Adverse effect of antineoplastic and immunosuppressive drugs, initial encounter: Secondary | ICD-10-CM | POA: Diagnosis present

## 2014-10-08 DIAGNOSIS — Z886 Allergy status to analgesic agent status: Secondary | ICD-10-CM

## 2014-10-08 DIAGNOSIS — I340 Nonrheumatic mitral (valve) insufficiency: Secondary | ICD-10-CM | POA: Diagnosis present

## 2014-10-08 DIAGNOSIS — Z79810 Long term (current) use of selective estrogen receptor modulators (SERMs): Secondary | ICD-10-CM | POA: Diagnosis not present

## 2014-10-08 DIAGNOSIS — K5790 Diverticulosis of intestine, part unspecified, without perforation or abscess without bleeding: Secondary | ICD-10-CM | POA: Diagnosis present

## 2014-10-08 DIAGNOSIS — I638 Other cerebral infarction: Secondary | ICD-10-CM | POA: Diagnosis not present

## 2014-10-08 DIAGNOSIS — C50919 Malignant neoplasm of unspecified site of unspecified female breast: Secondary | ICD-10-CM | POA: Diagnosis not present

## 2014-10-08 DIAGNOSIS — Z9012 Acquired absence of left breast and nipple: Secondary | ICD-10-CM | POA: Diagnosis not present

## 2014-10-08 DIAGNOSIS — Q211 Atrial septal defect: Secondary | ICD-10-CM | POA: Diagnosis not present

## 2014-10-08 DIAGNOSIS — D329 Benign neoplasm of meninges, unspecified: Secondary | ICD-10-CM | POA: Diagnosis present

## 2014-10-08 DIAGNOSIS — G252 Other specified forms of tremor: Secondary | ICD-10-CM | POA: Diagnosis present

## 2014-10-08 DIAGNOSIS — D61810 Antineoplastic chemotherapy induced pancytopenia: Secondary | ICD-10-CM | POA: Diagnosis present

## 2014-10-08 DIAGNOSIS — R4701 Aphasia: Secondary | ICD-10-CM | POA: Diagnosis present

## 2014-10-08 DIAGNOSIS — Z79899 Other long term (current) drug therapy: Secondary | ICD-10-CM

## 2014-10-08 DIAGNOSIS — C7951 Secondary malignant neoplasm of bone: Secondary | ICD-10-CM | POA: Diagnosis present

## 2014-10-08 DIAGNOSIS — Z5181 Encounter for therapeutic drug level monitoring: Secondary | ICD-10-CM | POA: Diagnosis not present

## 2014-10-08 DIAGNOSIS — K5730 Diverticulosis of large intestine without perforation or abscess without bleeding: Secondary | ICD-10-CM | POA: Diagnosis present

## 2014-10-08 DIAGNOSIS — G8194 Hemiplegia, unspecified affecting left nondominant side: Secondary | ICD-10-CM

## 2014-10-08 DIAGNOSIS — Z7982 Long term (current) use of aspirin: Secondary | ICD-10-CM | POA: Diagnosis not present

## 2014-10-08 DIAGNOSIS — I6340 Cerebral infarction due to embolism of unspecified cerebral artery: Secondary | ICD-10-CM | POA: Diagnosis present

## 2014-10-08 DIAGNOSIS — C7931 Secondary malignant neoplasm of brain: Secondary | ICD-10-CM | POA: Diagnosis present

## 2014-10-08 DIAGNOSIS — E785 Hyperlipidemia, unspecified: Secondary | ICD-10-CM | POA: Diagnosis present

## 2014-10-08 DIAGNOSIS — R259 Unspecified abnormal involuntary movements: Secondary | ICD-10-CM

## 2014-10-08 DIAGNOSIS — R251 Tremor, unspecified: Secondary | ICD-10-CM | POA: Diagnosis present

## 2014-10-08 LAB — DIFFERENTIAL
Basophils Absolute: 0.1 10*3/uL (ref 0–0.1)
Eosinophils Absolute: 0 10*3/uL (ref 0–0.7)
Eosinophils Relative: 1 %
Lymphs Abs: 1.9 10*3/uL (ref 1.0–3.6)
MONO ABS: 0.1 10*3/uL — AB (ref 0.2–0.9)
Monocytes Relative: 4 %
Neutro Abs: 1.5 10*3/uL (ref 1.4–6.5)

## 2014-10-08 LAB — CBC
HCT: 28.1 % — ABNORMAL LOW (ref 35.0–47.0)
Hemoglobin: 9.5 g/dL — ABNORMAL LOW (ref 12.0–16.0)
MCH: 32 pg (ref 26.0–34.0)
MCHC: 33.6 g/dL (ref 32.0–36.0)
MCV: 95.1 fL (ref 80.0–100.0)
PLATELETS: 108 10*3/uL — AB (ref 150–440)
RBC: 2.95 MIL/uL — ABNORMAL LOW (ref 3.80–5.20)
RDW: 25.6 % — ABNORMAL HIGH (ref 11.5–14.5)
WBC: 3.6 10*3/uL (ref 3.6–11.0)

## 2014-10-08 LAB — COMPREHENSIVE METABOLIC PANEL
ALT: 11 U/L — AB (ref 14–54)
AST: 26 U/L (ref 15–41)
Albumin: 3.2 g/dL — ABNORMAL LOW (ref 3.5–5.0)
Alkaline Phosphatase: 65 U/L (ref 38–126)
Anion gap: 9 (ref 5–15)
BUN: 17 mg/dL (ref 6–20)
CALCIUM: 8.7 mg/dL — AB (ref 8.9–10.3)
CHLORIDE: 101 mmol/L (ref 101–111)
CO2: 26 mmol/L (ref 22–32)
CREATININE: 1.08 mg/dL — AB (ref 0.44–1.00)
GFR calc non Af Amer: 48 mL/min — ABNORMAL LOW (ref >60)
GFR, EST AFRICAN AMERICAN: 55 mL/min — AB (ref >60)
GLUCOSE: 126 mg/dL — AB (ref 65–99)
Potassium: 4.2 mmol/L (ref 3.5–5.1)
Sodium: 136 mmol/L (ref 135–145)
Total Bilirubin: 0.3 mg/dL (ref 0.3–1.2)
Total Protein: 6.7 g/dL (ref 6.5–8.1)

## 2014-10-08 LAB — URINALYSIS COMPLETE WITH MICROSCOPIC (ARMC ONLY)
Bacteria, UA: NONE SEEN
Bilirubin Urine: NEGATIVE
Glucose, UA: NEGATIVE mg/dL
Hgb urine dipstick: NEGATIVE
Ketones, ur: NEGATIVE mg/dL
Leukocytes, UA: NEGATIVE
NITRITE: NEGATIVE
PH: 8 (ref 5.0–8.0)
PROTEIN: NEGATIVE mg/dL
Specific Gravity, Urine: 1.009 (ref 1.005–1.030)

## 2014-10-08 LAB — URINE DRUG SCREEN, QUALITATIVE (ARMC ONLY)
Amphetamines, Ur Screen: NOT DETECTED
Barbiturates, Ur Screen: NOT DETECTED
Benzodiazepine, Ur Scrn: NOT DETECTED
Cannabinoid 50 Ng, Ur ~~LOC~~: NOT DETECTED
Cocaine Metabolite,Ur ~~LOC~~: NOT DETECTED
MDMA (ECSTASY) UR SCREEN: NOT DETECTED
Methadone Scn, Ur: NOT DETECTED
Opiate, Ur Screen: NOT DETECTED
PHENCYCLIDINE (PCP) UR S: NOT DETECTED
TRICYCLIC, UR SCREEN: NOT DETECTED

## 2014-10-08 LAB — PROTIME-INR
INR: 0.96
PROTHROMBIN TIME: 13 s (ref 11.4–15.0)

## 2014-10-08 LAB — GLUCOSE, CAPILLARY: GLUCOSE-CAPILLARY: 111 mg/dL — AB (ref 70–99)

## 2014-10-08 LAB — APTT: aPTT: 28 seconds (ref 24–36)

## 2014-10-08 MED ORDER — MORPHINE SULFATE 2 MG/ML IJ SOLN: 1.0000 mg | INTRAMUSCULAR | Status: DC | PRN

## 2014-10-08 MED ORDER — DEXAMETHASONE SODIUM PHOSPHATE 10 MG/ML IJ SOLN
INTRAMUSCULAR | Status: AC
Start: 2014-10-08 — End: 2014-10-08
  Administered 2014-10-08: 10 mg via INTRAVENOUS
  Filled 2014-10-08: qty 1

## 2014-10-08 MED ORDER — DEXAMETHASONE SODIUM PHOSPHATE 10 MG/ML IJ SOLN
10.0000 mg | Freq: Once | INTRAMUSCULAR | Status: AC
  Administered 2014-10-08: 10 mg via INTRAVENOUS

## 2014-10-08 MED ORDER — ASPIRIN EC 81 MG PO TBEC
81.0000 mg | DELAYED_RELEASE_TABLET | Freq: Every day | ORAL | Status: DC
  Administered 2014-10-09 – 2014-10-12 (×4): 81 mg via ORAL
  Filled 2014-10-08 (×5): qty 1

## 2014-10-08 MED ORDER — IOHEXOL 300 MG/ML  SOLN
25.0000 mL | INTRAMUSCULAR | Status: AC
  Administered 2014-10-08 (×2): 25 mL via ORAL

## 2014-10-08 MED ORDER — VITAMIN D3 25 MCG (1000 UNIT) PO TABS
1000.0000 [IU] | ORAL_TABLET | Freq: Every day | ORAL | Status: DC
  Administered 2014-10-09 – 2014-10-12 (×4): 1000 [IU] via ORAL
  Filled 2014-10-08 (×4): qty 1

## 2014-10-08 MED ORDER — DEXAMETHASONE SODIUM PHOSPHATE 4 MG/ML IJ SOLN
4.0000 mg | Freq: Four times a day (QID) | INTRAMUSCULAR | Status: DC
  Administered 2014-10-08 – 2014-10-09 (×2): 4 mg via INTRAVENOUS
  Filled 2014-10-08 (×7): qty 1

## 2014-10-08 MED ORDER — ONDANSETRON HCL 4 MG PO TABS: 4.0000 mg | ORAL_TABLET | Freq: Four times a day (QID) | ORAL | Status: DC | PRN

## 2014-10-08 MED ORDER — ACETAMINOPHEN 650 MG RE SUPP: 650.0000 mg | Freq: Four times a day (QID) | RECTAL | Status: DC | PRN

## 2014-10-08 MED ORDER — ACETAMINOPHEN 325 MG PO TABS
650.0000 mg | ORAL_TABLET | Freq: Four times a day (QID) | ORAL | Status: DC | PRN
  Administered 2014-10-09 (×2): 650 mg via ORAL
  Filled 2014-10-08 (×3): qty 2

## 2014-10-08 MED ORDER — GABAPENTIN 300 MG PO CAPS
300.0000 mg | ORAL_CAPSULE | Freq: Three times a day (TID) | ORAL | Status: DC
  Administered 2014-10-08 – 2014-10-12 (×11): 300 mg via ORAL
  Filled 2014-10-08 (×13): qty 1

## 2014-10-08 MED ORDER — ENOXAPARIN SODIUM 40 MG/0.4ML ~~LOC~~ SOLN
40.0000 mg | SUBCUTANEOUS | Status: DC
  Administered 2014-10-08 – 2014-10-11 (×4): 40 mg via SUBCUTANEOUS
  Filled 2014-10-08 (×5): qty 0.4

## 2014-10-08 MED ORDER — PANTOPRAZOLE SODIUM 40 MG PO TBEC
40.0000 mg | DELAYED_RELEASE_TABLET | Freq: Two times a day (BID) | ORAL | Status: DC
  Administered 2014-10-09 – 2014-10-12 (×6): 40 mg via ORAL
  Filled 2014-10-08 (×5): qty 1

## 2014-10-08 MED ORDER — GADOBENATE DIMEGLUMINE 529 MG/ML IV SOLN
10.0000 mL | Freq: Once | INTRAVENOUS | Status: AC | PRN
  Administered 2014-10-08: 10 mL via INTRAVENOUS

## 2014-10-08 MED ORDER — SODIUM CHLORIDE 0.9 % IV SOLN
INTRAVENOUS | Status: DC
  Administered 2014-10-08 – 2014-10-10 (×3): via INTRAVENOUS

## 2014-10-08 MED ORDER — KRILL OIL 300 MG PO CAPS: 300.0000 mg | ORAL_CAPSULE | Freq: Every day | ORAL | Status: DC

## 2014-10-08 MED ORDER — ONDANSETRON HCL 4 MG/2ML IJ SOLN: 4.0000 mg | Freq: Four times a day (QID) | INTRAMUSCULAR | Status: DC | PRN

## 2014-10-08 MED ORDER — CALCIUM CARBONATE 1250 (500 CA) MG PO TABS
1.0000 | ORAL_TABLET | Freq: Every day | ORAL | Status: DC
  Administered 2014-10-09 – 2014-10-12 (×4): 500 mg via ORAL
  Filled 2014-10-08 (×6): qty 1

## 2014-10-08 MED ORDER — IOHEXOL 300 MG/ML  SOLN
70.0000 mL | Freq: Once | INTRAMUSCULAR | Status: AC | PRN
  Administered 2014-10-08: 70 mL via INTRAVENOUS

## 2014-10-08 NOTE — H&P (Signed)
[facility] History and Physical  Diana Ellis STM:[id number] DOB: Jun 30, 1933 DOA: 10/08/2014  Referring physician:  PCP: Diana Pitch, MD  Specialists:   Chief Complaint: Slurred speech/left hemiparesis  HPI: Diana Ellis is a 81 y.o. WF PMHx diagnosed January 2011 breast cancer T2, N1a, M0; ER 25%, PR 50%, HER-2/neu not over expressed. Mammoprint: High risk for recurrent. 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 Tamoxifen stopped and patient started on Ibrance.  HTN, mitral valve prolapse, moderate mitral and tricuspid regurgitation and palpitations, PVCs, laryngeal stenosis, HLD, diverticulosis, H. pylori gastritis Presents [facility] ED with speech disturbance first noted at 12:45 PM by her son when he called her to check on her. She has not noted any abnormality in her speech today. Family also noted that she had left-sided weakness and was leaning to the left while walking. Exact onset of symptoms is unknown. Severity mild. Symptoms are not worsened by activity. Nothing makes it better or worse. She has overall been generally weak for the past 2 months, worse for the past 5 days. No recent illness including no cough, sneezing, runny nose, congestion, nausea vomiting or diarrhea. No fevers. Son states that she has had hand tremors since starting the [place]   Review of Systems: The patient denies anorexia, fever, weight loss,, decreased hearing, hoarseness, chest pain, syncope, dyspnea on exertion, peripheral edema,hemoptysis, abdominal pain, melena, hematochezia, severe indigestion/heartburn, hematuria, incontinence, genital sores,  suspicious skin lesions, transient blindness,depression, unusual weight change, abnormal bleeding, enlarged lymph nodes, angioedema, and breast  masses.   TRAVEL HISTORY:    Consultants:  Dr. Nicole Kindred (neurology); [place] phone consult    Procedure/Significant Events:  5/1 CT head without contrast;Subcortical hypodensity in the left frontal lobe, measuring 3.5 x 3.8 cm, worrisome for metastasis, less likely acute ischemic stroke.     Culture     Antibiotics:     DVT prophylaxis:  Lovenox  Devices     LINES / TUBES:     Past Medical History  Diagnosis Date  . Allergy   . Hypertension 2006  . Arthritis   . Bursitis   . Cardiac disease   . Heart murmur   . Cancer   . Malignant neoplasm of breast (female), unspecified site June 21, 2009    T2, N1a, M0; ER 25%, PR 50%, HER-2/neu not over expressed. Mammoprint: High risk for recurrent. Oncology Protocol: Patient is on Research Protocol E 5103 and will receive AC every 3 weeks x 4, then Taxol weekly x 12 treatments and Avastin vs Placebo through the [facility] and Taxol portion of trial.   Past Surgical History  Procedure Laterality Date  . Colonoscopy  2004    Dr Vira Agar  . Tracheal surgery  2012  . Port a cath revision  2011  . Upper gi endoscopy    . Abdominal hysterectomy  1983  . Bronchoscopy  2002  . Port-a-cath removal  2012  . Tonsillectomy  1957  . Breast surgery Left June 21, 2009    MASTECTOMY,, sentinel node biopsy  . Esophageal dilation  07/28/14.     Dr. Vira Agar   Social History:  reports that she has never smoked. She has never used smokeless tobacco. She reports that she does not drink alcohol or use illicit drugs. Home by herself Can patient participate in ADLs? Yes  Allergies  Allergen Reactions  . Codeine Nausea Only    Family History  Problem Relation Age of Onset  . Cancer Mother     lung     Prior to Admission medications   Medication Sig Start Date End Date Taking? Authorizing Provider  acetaminophen (TYLENOL ARTHRITIS PAIN) 650 MG CR tablet Take 1,300 mg by mouth at bedtime.    Yes Historical Provider, MD   calcium carbonate (OS-CAL) 600 MG TABS tablet Take 600 mg by mouth daily with breakfast.   Yes Historical Provider, MD  cholecalciferol (VITAMIN D) 1000 UNITS tablet Take 1,000 Units by mouth daily.   Yes Historical Provider, MD  Fulvestrant (FASLODEX IM) Inject 1 Dose into the muscle every 28 (twenty-eight) days.   Yes Historical Provider, MD  gabapentin (NEURONTIN) 300 MG capsule Take 300 mg by mouth 3 (three) times daily.   Yes Historical Provider, MD  Glucosamine-Chondroit-Vit C-Mn (GLUCOSAMINE 1500 COMPLEX) CAPS Take by mouth daily.   Yes Historical Provider, MD  Javier Docker Oil 300 MG CAPS Take 300 mg by mouth daily.   Yes Historical Provider, MD  MAGNESIUM ASPARTATE PO Take 1 tablet by mouth daily.   Yes Historical Provider, MD  omeprazole (PRILOSEC) 20 MG capsule Take 20 mg by mouth 2 (two) times daily before a meal.   Yes Historical Provider, MD  palbociclib (IBRANCE) 125 MG capsule Take 125 mg by mouth daily with lunch. Take whole with food for 21 days.   Yes Historical Provider, MD  PRESCRIPTION MEDICATION Inject 1 Dose into the muscle every 14 (fourteen) days.   Yes Historical Provider, MD  triamterene-hydrochlorothiazide (MAXZIDE-25) 37.5-25 MG per tablet Take 1 tablet by mouth daily.   Yes Historical Provider, MD   Physical Exam: Filed Vitals:   10/08/14 1745 10/08/14 1748 10/08/14 2225 10/08/14 2256  BP:  143/64  148/78  Pulse: 84 86  93  Temp:   97.6 F (36.4 C)   TempSrc:      Resp: _0 Height:    5' 5" (1.651 m)  Weight:    47.25 kg (104 lb 2.7 oz)  SpO2: 99% 98%  97%     General:  A/O 4, NAD,  Eyes: Pupils equal round reactive to light and accommodation, somewhat sluggish  Neck: Negative lymphedema, negative JVD  Cardiovascular: Regular rhythm and rate, systolic murmur, negative murmurs rubs gallops  Respiratory: Clear to auscultation bilateral  Abdomen: Soft, nontender, nondistended, plus bowel sound  Musculoskeletal: Within normal limit  Neurologic:  Cranial nerves II through XII intact, tongue/uvula midline, all extremity strength 5/5, some difficulty rising from seated to standing position, sensation intact throughout, quick finger touches bilateral within normal limit, finger nose finger bilateral within normal limit, negative Romberg sign, heel-to-shin bilateral within normal limit, DTR +2 bilateral Babinski downgoing bilateral. Bilateral resting tremor of the hands  Labs on Admission:  Basic Metabolic Panel:  Recent Labs Lab 10/03/14 1359 10/08/14 1442 10/08/14 2317  NA 132* 136  --   K 4.4 4.2  --   CL 100* 101  --   CO2 25 26  --   GLUCOSE 138* 126*  --   BUN 19 17  --   CREATININE 0.98 1.08* 1.10*  CALCIUM 8.7* 8.7*  --    Liver Function Tests:  Recent Labs Lab 10/08/14 1442  AST 26  ALT 11*  ALKPHOS 65  BILITOT 0.3  PROT 6.7  ALBUMIN 3.2*   No results for input(s): LIPASE, AMYLASE in the last 168  hours. No results for input(s): AMMONIA in the last 168 hours. CBC:  Recent Labs Lab 10/03/14 1359 10/08/14 1442 10/08/14 2317  WBC 1.7* 3.6 1.8*  NEUTROABS 0.8* 1.5  --   HGB 9.1* 9.5* 8.9*  HCT 27.5* 28.1* 26.5*  MCV 93 95.1 91.7  PLT 167 108* 100*   Cardiac Enzymes: No results for input(s): CKTOTAL, CKMB, CKMBINDEX, TROPONINI in the last 168 hours.  BNP (last 3 results) No results for input(s): BNP in the last 8760 hours.  ProBNP (last 3 results) No results for input(s): PROBNP in the last 8760 hours.  CBG:  Recent Labs Lab 10/08/14 1512  GLUCAP 111*    Radiological Exams on Admission: Ct Head Wo Contrast  10/08/2014   CLINICAL DATA:  Code stroke, slurred speech, history of metastatic breast cancer  EXAM: CT HEAD WITHOUT CONTRAST  TECHNIQUE: Contiguous axial images were obtained from the base of the skull through the vertex without intravenous contrast.  COMPARISON:  None.  FINDINGS: Hypodensity in the subcortical left frontal lobe (series 2/image 10), measuring 3.5 x 3.8 cm. While ischemic  stroke is possible, the appearance is worrisome for vasogenic edema related to metastasis.  No evidence of parenchymal hemorrhage or extra-axial fluid collection.  No midline shift.  No CT evidence of acute infarction.  Subcortical white matter and periventricular small vessel ischemic changes.  Cerebral volume is within normal limits.  No ventriculomegaly.  The visualized paranasal sinuses are essentially clear. The mastoid air cells are unopacified.  No fracture or dislocation is seen.  IMPRESSION: Subcortical hypodensity in the left frontal lobe, measuring 3.5 x 3.8 cm, worrisome for metastasis, less likely acute ischemic stroke.  Consider MRI brain with contrast for further evaluation as clinically warranted.  These results were called by telephone at the time of interpretation on 10/08/2014 at 3:31 pm to Dr. [name] , who verbally acknowledged these results.   Electronically Signed   By: Julian Hy M.D.   On: 10/08/2014 15:34   Mr Jeri Cos UX Contrast  10/08/2014   CLINICAL DATA:  Speech disturbance noted early this afternoon, now resolved. LEFT-sided weakness, worse than generalized weakness for the last 2 months. History of breast cancer, diffuse metastasis.  EXAM: MRI HEAD WITHOUT CONTRAST  TECHNIQUE: Multiplanar, multiecho pulse sequences of the brain and surrounding structures were obtained without intravenous contrast.  COMPARISON:  CT of the head Oct 08, 2014 at 1519 hours and head CT August 16, 2014  FINDINGS: Low T2, intermediate to low T1 homogeneously enhancing 2 x 2.4 cm mass LEFT inferior frontal lobe, with slight apparent dural tail. Surrounding T2 bright vasogenic edema and local sulcal effacement, without midline shift. No additional suspicious intracranial enhancement.  Multiple subcentimeter foci of reduced diffusion bilateral inferior cerebellum, with corresponding low ADC values. Similar subcentimeter foci and posterior frontal lobes and parietal lobes bilaterally. Punctate focus of  susceptibility artifact in LEFT frontal lobe. The ventricles and sulci are normal for patient's age. Scattered subcentimeter supratentorial white matter FLAIR T2 hyperintensities exclusive of the aforementioned foci of acute ischemia. Tiny bilateral basal ganglia perivascular spaces, unlikely to reflect the lacunar infarcts.  No abnormal extra-axial fluid collections. Normal major intracranial vascular flow voids seen at the skull base. Scattered small mucosal retention cyst without paranasal sinus air-fluid levels. Minimal RIGHT mastoid effusion. Ocular globes and orbital contents appear normal though not tailored for evaluation. No abnormal sellar expansion. No cerebellar tonsillar ectopia. Abnormal low T1 signal in the LEFT frontal calvarium without enhancement, unlikely to reflect  metastasis.  IMPRESSION: 2 x 2.4 cm LEFT inferior frontal lobe mass with imaging characteristics of meningioma, less likely dural metastasis with surrounding vasogenic edema.  Multiple acute bifrontal, biparietal and bilateral cerebellar subcentimeter infarcts likely representing embolic phenomena.   Electronically Signed   By: Elon Alas   On: 10/08/2014 22:36   Dg Chest Portable 1 View  10/08/2014   CLINICAL DATA:  Confusion today. History of metastatic breast cancer. Initial encounter.  EXAM: PORTABLE CHEST - 1 VIEW  COMPARISON:  Radiographs 08/02/2009. Chest CT 08/03/2014 and PET-CT 08/16/2014.  FINDINGS: 1545 hour. The heart size and mediastinal contours are stable. The lungs are clear. There is no pleural effusion or pneumothorax. The known osseous metastases are not well visualized. Telemetry leads overlie the chest. Patient is status post left mastectomy.  IMPRESSION: No active cardiopulmonary process.   Electronically Signed   By: Richardean Sale M.D.   On: 10/08/2014 16:05    EKG:   Assessment/Plan Active Problems:   Breast cancer   Dysphagia, pharyngoesophageal phase   Left hemiparesis   Slurred speech    Bone metastases   Brain metastases   HTN (hypertension)   Mitral valve prolapse   Mitral regurgitation   HLD (hyperlipidemia)   Laryngeal stenosis   Diverticulosis of colon without hemorrhage   Metastatic cancer to brain   Resting tremor   Metastatic breast cancer/Brain metastasis/Bone metastasis -Brain MRI pending -Chest CT with contrast pending -CT abdomen pelvis with contrast pending -In a.m. contact Dr.Choksi Delorise Shiner (oncology)  -In a.m. contact radiation oncology dependent upon findings of MRI and CT scans -Dexamethasone 4 mg QID; has already received 10 mg 1  Left hemiparesis/slurred speech -Resolved  Resting tremor -Baseline per son   HTN -Hold home BP medication; within the guidelines for patient of her age.   HLD -Lipid panel pending      Code Status: Full  Family Communication: Whole family present  Disposition Plan: Per oncology/radiation oncology   Time spent: 64 minutes   WOOD[place], [facility] Pager [phone]  If 7PM-7AM, please contact night-coverage [email or web address] Password [email or web address] 10/09/2014, 1:16 AM

## 2014-10-08 NOTE — ED Provider Notes (Signed)
[facility] Emergency Department Provider Note    ____________________________________________  Time seen: on arrival    I have reviewed the triage vital signs and the nursing notes.   HISTORY  Chief Complaint Code Stroke       HPI Diana Ellis is a 81 y.o. female with history of breast cancer on tamoxifen who presents with speech disturbance first noted at 12:45 PM by her son when he called her to check on her. She has not noted any abnormality in her speech today. Family also noted that she had left-sided weakness and was leaning to the left while walking. Exact onset of symptoms is unknown. Severity mild. Symptoms are not worsened by activity. Nothing makes it better or worse. She has overall been generally weak for the past 2 months, worse for the past 5 days. No recent illness including no cough, sneezing, runny nose, congestion, nausea vomiting or diarrhea. No fevers.    Past Medical History  Diagnosis Date  . Allergy   . Hypertension 2006  . Arthritis   . Bursitis   . Cardiac disease   . Heart murmur   . Cancer   . Malignant neoplasm of breast (female), unspecified site June 21, 2009    T2, N1a, M0; ER 25%, PR 50%, HER-2/neu not over expressed. Mammoprint: High risk for recurrent. Oncology Protocol: Patient is on Research Protocol E 5103 and will receive AC every 3 weeks x 4, then Taxol weekly x 12 treatments and Avastin vs Placebo through the [facility] and Taxol portion of trial.    Patient Active Problem List   Diagnosis Date Noted  . Loss of weight 08/02/2014  . Dysphagia, pharyngoesophageal phase 08/02/2014  . Breast cancer 07/21/2013    Past Surgical History  Procedure Laterality Date  . Colonoscopy  2004    Dr Vira Agar  . Tracheal surgery  2012  . Port a cath revision  2011  . Upper gi endoscopy    . Abdominal hysterectomy  1983  . Bronchoscopy  2002  . Port-a-cath removal  2012  . Tonsillectomy  1957  . Breast surgery Left  June 21, 2009    MASTECTOMY,, sentinel node biopsy  . Esophageal dilation  07/28/14.     Dr. Vira Agar    Current Outpatient Rx  Name  Route  Sig  Dispense  Refill  . acetaminophen (TYLENOL ARTHRITIS PAIN) 650 MG CR tablet   Oral   Take 650 mg by mouth 2 (two) times daily.         Marland Kitchen aspirin 325 MG tablet   Oral   Take 325 mg by mouth daily. 1/2 tablet daily         . calcium carbonate (OS-CAL) 600 MG TABS tablet   Oral   Take 600 mg by mouth daily with breakfast.         . cholecalciferol (VITAMIN D) 1000 UNITS tablet   Oral   Take 1,000 Units by mouth daily.         Marland Kitchen gabapentin (NEURONTIN) 300 MG capsule   Oral   Take 300 mg by mouth 3 (three) times daily.         . Glucosamine-Chondroit-Vit C-Mn (GLUCOSAMINE 1500 COMPLEX) CAPS   Oral   Take by mouth daily.         Marland Kitchen KRILL OIL PO   Oral   Take by mouth daily.         . metoprolol succinate (TOPROL-XL) 25 MG 24 hr tablet  Oral   Take 1 tablet by mouth.         . ranitidine (ZANTAC) 150 MG capsule   Oral   Take 150 mg by mouth 2 (two) times daily.         . tamoxifen (NOLVADEX) 20 MG tablet   Oral   Take 20 mg by mouth daily.         Marland Kitchen triamterene-hydrochlorothiazide (MAXZIDE-25) 37.5-25 MG per tablet   Oral   Take 1 tablet by mouth daily.           Allergies Codeine  Family History  Problem Relation Age of Onset  . Cancer Mother     lung    Social History History  Substance Use Topics  . Smoking status: Never Smoker   . Smokeless tobacco: Never Used  . Alcohol Use: No    Review of Systems Constitutional: Negative for fever. Eyes: Negative for visual changes. ENT: Negative for sore throat. Cardiovascular: Negative for chest pain. Respiratory: Negative for shortness of breath. Gastrointestinal: Negative for abdominal pain, vomiting and diarrhea. Genitourinary: Negative for dysuria. Musculoskeletal: Negative for back pain. Skin: Negative for rash. Neurological:  Negative for headaches + for  focal weakness. }  10-point ROS otherwise negative.  ____________________________________________   PHYSICAL EXAM:  VITAL SIGNS: ED Triage Vitals  Enc Vitals Group     BP 10/08/14 1507 148/68 mmHg     Pulse Rate 10/08/14 1507 87     Resp --      Temp 10/08/14 1507 97.4 F (36.3 C)     Temp Source 10/08/14 1507 Oral     SpO2 10/08/14 1507 98 %     Weight 10/08/14 1507 102 lb (46.267 kg)     Height 10/08/14 1507 _0  (1.651 m)     Head Cir --      Peak Flow --      Pain Score --      Pain Loc --      Pain Edu? --      Excl. in [place]? --      Constitutional: Alert and oriented. Well appearing and in no distress. Eyes: Conjunctivae are normal. PERRL. Normal extraocular movements. ENT   Head: Normocephalic and atraumatic.   Nose: No congestion/rhinnorhea.   Mouth/Throat: Mucous membranes are moist.   Neck: No stridor. Hematological/Lymphatic/Immunilogical: No cervical lymphadenopathy. Cardiovascular: Normal rate, regular rhythm. Normal and symmetric distal pulses are present in all extremities. No murmurs, rubs, or gallops. Respiratory: Normal respiratory effort without tachypnea nor retractions. Breath sounds are clear and equal bilaterally. No wheezes/rales/rhonchi. Gastrointestinal: Soft and nontender. No distention. No abdominal bruits. There is no CVA tenderness. Genitourinary: deferred Musculoskeletal: Nontender with normal range of motion in all extremities. No joint effusions.  No lower extremity tenderness nor edema. Neurologic:  Normal speech and language. Faint last mild left lower facial droop, no pronator drift, normal heel to shin, mild left upper and left lower extremity weakness Skin:  Skin is warm, dry and intact. No rash noted. Psychiatric: Mood and affect are normal. Speech and behavior are normal. Patient exhibits appropriate insight and judgment.  ____________________________________________   EKG  ED ECG  REPORT   Date: 10/08/2014  EKG Time: 15:05  Rate: 91  Rhythm: Normal sinus rhythm  Axis: Normal  Intervals:none  ST&T Change: No acute ST elevation   ____________________________________________    RADIOLOGY   CT head IMPRESSION: Subcortical hypodensity in the left frontal lobe, measuring 3.5 x 3.8 cm, worrisome for metastasis, less likely  acute ischemic stroke.  Consider MRI brain with contrast for further evaluation as clinically warranted.  These results were called by telephone at the time of interpretation on 10/08/2014 at 3:31 pm to Dr. [name] , who verbally acknowledged these results.   Electronically Signed By: Julian Hy M.D. On: 10/08/2014 15:34   CXR: IMPRESSION: No active cardiopulmonary process.   Electronically Signed  By: Richardean Sale M.D.  On: 10/08/2014 16:05IMPRESSION: No active cardiopulmonary process.   Electronically Signed  By: Richardean Sale M.D.  On: 10/08/2014 16:05    ____________________________________________   PROCEDURES    Critical Care performed: Yes, see critical care note(s); total critical care time 45 minutes  ____________________________________________   INITIAL IMPRESSION / ASSESSMENT AND PLAN / ED COURSE  Pertinent labs & imaging results that were available during my care of the patient were reviewed by me and considered in my medical decision making (see chart for details).   Physical 81 year old female with active breast cancer currently being treated by Dr. Oliva Bustard who presents with mild left facial droop, mild left upper and lower extremity weakness, onset unknown. NIHSS 2. Discussed with radiologist that the patient has left frontal hypodensity which is more likely than not metastasis with some vasogenic edema versus acute CVA. Discussed with Dr. Nicole Kindred of neurology who recommends against TPA given mild deficits and unknown time of onset and concern for intracranial mass.  Assessed with Dr. Tyson Dense of neurosurgery who recommends Decadron which I have ordered, also recommends admission to the hospitalist. Discussed with Dr. Sherrian Divers hospitalist who will admit. Patient and family comfortable with transfer. Labs reviewed and stable  ____________________________________________   FINAL CLINICAL IMPRESSION(S) / ED DIAGNOSES  Final diagnoses:  Brain metastasis     Joanne Gavel, MD 10/08/14 [phone]

## 2014-10-08 NOTE — Progress Notes (Signed)
New Admission Note:   Arrival: Transfer from [facility] ED, arrived on stretcher Mental Orientation: A&Ox4 Telemetry: placed on box 6e01, NSR Assessment:  See doc flowsheet Skin: intact IV: right forearm IV Pain: none  Safety Measures:  Call bell placed within reach; patient instructed on use of call bell and verbalized understanding. Bed in lowest position. Bed alarm on. [address] Orientation: Patient oriented to staff, room, and unit. Family: son at bedside  Orders have been reviewed and implemented. Will continue to monitor.  Arlyss Queen, RN, BSN

## 2014-10-09 ENCOUNTER — Encounter (HOSPITAL_COMMUNITY): Payer: Self-pay | Admitting: *Deleted

## 2014-10-09 ENCOUNTER — Inpatient Hospital Stay (HOSPITAL_COMMUNITY): Payer: Medicare Other

## 2014-10-09 DIAGNOSIS — C50919 Malignant neoplasm of unspecified site of unspecified female breast: Secondary | ICD-10-CM

## 2014-10-09 DIAGNOSIS — K5730 Diverticulosis of large intestine without perforation or abscess without bleeding: Secondary | ICD-10-CM

## 2014-10-09 DIAGNOSIS — I6789 Other cerebrovascular disease: Secondary | ICD-10-CM

## 2014-10-09 DIAGNOSIS — I638 Other cerebral infarction: Secondary | ICD-10-CM

## 2014-10-09 DIAGNOSIS — C7951 Secondary malignant neoplasm of bone: Secondary | ICD-10-CM

## 2014-10-09 DIAGNOSIS — I10 Essential (primary) hypertension: Secondary | ICD-10-CM

## 2014-10-09 DIAGNOSIS — G8190 Hemiplegia, unspecified affecting unspecified side: Secondary | ICD-10-CM

## 2014-10-09 DIAGNOSIS — R4781 Slurred speech: Secondary | ICD-10-CM

## 2014-10-09 DIAGNOSIS — G252 Other specified forms of tremor: Secondary | ICD-10-CM | POA: Diagnosis present

## 2014-10-09 DIAGNOSIS — C7931 Secondary malignant neoplasm of brain: Secondary | ICD-10-CM

## 2014-10-09 DIAGNOSIS — R259 Unspecified abnormal involuntary movements: Secondary | ICD-10-CM

## 2014-10-09 DIAGNOSIS — I340 Nonrheumatic mitral (valve) insufficiency: Secondary | ICD-10-CM

## 2014-10-09 DIAGNOSIS — D329 Benign neoplasm of meninges, unspecified: Secondary | ICD-10-CM

## 2014-10-09 DIAGNOSIS — R258 Other abnormal involuntary movements: Secondary | ICD-10-CM

## 2014-10-09 DIAGNOSIS — E785 Hyperlipidemia, unspecified: Secondary | ICD-10-CM

## 2014-10-09 LAB — LIPID PANEL
Cholesterol: 221 mg/dL — ABNORMAL HIGH (ref 0–200)
HDL: 57 mg/dL (ref >40)
LDL Cholesterol: 142 mg/dL — ABNORMAL HIGH (ref 0–99)
Total CHOL/HDL Ratio: 3.9 RATIO
Triglycerides: 111 mg/dL (ref <150)
VLDL: 22 mg/dL (ref 0–40)

## 2014-10-09 LAB — CBC
HCT: 26.5 % — ABNORMAL LOW (ref 36.0–46.0)
Hemoglobin: 8.9 g/dL — ABNORMAL LOW (ref 12.0–15.0)
MCH: 30.8 pg (ref 26.0–34.0)
MCHC: 33.6 g/dL (ref 30.0–36.0)
MCV: 91.7 fL (ref 78.0–100.0)
PLATELETS: 100 10*3/uL — AB (ref 150–400)
RBC: 2.89 MIL/uL — ABNORMAL LOW (ref 3.87–5.11)
RDW: 22.5 % — AB (ref 11.5–15.5)
WBC: 1.8 10*3/uL — AB (ref 4.0–10.5)

## 2014-10-09 LAB — CBC WITH DIFFERENTIAL/PLATELET
Basophils Absolute: 0 10*3/uL (ref 0.0–0.1)
Basophils Relative: 0 % (ref 0–1)
EOS ABS: 0 10*3/uL (ref 0.0–0.7)
Eosinophils Relative: 0 % (ref 0–5)
HEMATOCRIT: 26.3 % — AB (ref 36.0–46.0)
Hemoglobin: 8.8 g/dL — ABNORMAL LOW (ref 12.0–15.0)
Lymphocytes Relative: 23 % (ref 12–46)
Lymphs Abs: 0.7 10*3/uL (ref 0.7–4.0)
MCH: 30.7 pg (ref 26.0–34.0)
MCHC: 33.5 g/dL (ref 30.0–36.0)
MCV: 91.6 fL (ref 78.0–100.0)
Monocytes Absolute: 0 10*3/uL — ABNORMAL LOW (ref 0.1–1.0)
Monocytes Relative: 1 % — ABNORMAL LOW (ref 3–12)
NEUTROS PCT: 76 % (ref 43–77)
Neutro Abs: 2.2 10*3/uL (ref 1.7–7.7)
Platelets: 104 10*3/uL — ABNORMAL LOW (ref 150–400)
RBC: 2.87 MIL/uL — AB (ref 3.87–5.11)
RDW: 22.3 % — AB (ref 11.5–15.5)
WBC: 2.9 10*3/uL — AB (ref 4.0–10.5)

## 2014-10-09 LAB — COMPREHENSIVE METABOLIC PANEL
ALK PHOS: 69 U/L (ref 38–126)
ALT: 11 U/L — AB (ref 14–54)
AST: 23 U/L (ref 15–41)
Albumin: 2.8 g/dL — ABNORMAL LOW (ref 3.5–5.0)
Anion gap: 9 (ref 5–15)
BUN: 15 mg/dL (ref 6–20)
CO2: 22 mmol/L (ref 22–32)
Calcium: 7.9 mg/dL — ABNORMAL LOW (ref 8.9–10.3)
Chloride: 103 mmol/L (ref 101–111)
Creatinine, Ser: 0.87 mg/dL (ref 0.44–1.00)
Glucose, Bld: 151 mg/dL — ABNORMAL HIGH (ref 70–99)
Potassium: 4.4 mmol/L (ref 3.5–5.1)
SODIUM: 134 mmol/L — AB (ref 135–145)
TOTAL PROTEIN: 6.2 g/dL — AB (ref 6.5–8.1)
Total Bilirubin: 0.5 mg/dL (ref 0.3–1.2)

## 2014-10-09 LAB — CK TOTAL AND CKMB (NOT AT ARMC)
CK TOTAL: 53 U/L (ref 38–234)
CK, MB: 3.9 ng/mL (ref 0.5–5.0)
RELATIVE INDEX: INVALID (ref 0.0–2.5)

## 2014-10-09 LAB — CREATININE, SERUM
CREATININE: 1.1 mg/dL — AB (ref 0.44–1.00)
GFR calc Af Amer: 54 mL/min — ABNORMAL LOW (ref >60)
GFR calc non Af Amer: 47 mL/min — ABNORMAL LOW (ref >60)

## 2014-10-09 LAB — TSH: TSH: 0.722 u[IU]/mL (ref 0.350–4.500)

## 2014-10-09 LAB — MAGNESIUM: Magnesium: 2.1 mg/dL (ref 1.7–2.4)

## 2014-10-09 MED ORDER — ENSURE ENLIVE PO LIQD
237.0000 mL | Freq: Two times a day (BID) | ORAL | Status: DC
  Administered 2014-10-09 – 2014-10-12 (×7): 237 mL via ORAL

## 2014-10-09 MED ORDER — SIMVASTATIN 10 MG PO TABS
10.0000 mg | ORAL_TABLET | Freq: Every day | ORAL | Status: DC
  Administered 2014-10-09 – 2014-10-12 (×4): 10 mg via ORAL
  Filled 2014-10-09 (×4): qty 1

## 2014-10-09 MED ORDER — DEXAMETHASONE 4 MG PO TABS
4.0000 mg | ORAL_TABLET | Freq: Two times a day (BID) | ORAL | Status: DC
  Administered 2014-10-09 – 2014-10-12 (×7): 4 mg via ORAL
  Filled 2014-10-09 (×9): qty 1

## 2014-10-09 NOTE — Progress Notes (Signed)
VASCULAR LAB PRELIMINARY  PRELIMINARY  PRELIMINARY  PRELIMINARY  Carotid Duplex completed.    Preliminary report:  Carotid stenosis 1-39% bilaterally.   Vertebral arteries are patent and antegrade bilaterally.  August Albino, RVT 10/09/2014, 12:24 PM

## 2014-10-09 NOTE — Progress Notes (Signed)
[facility] PROGRESS NOTE  Diana Ellis HCW:[id number] DOB: Sep 30, 1933 DOA: 10/08/2014 PCP: [name], MD  Assessment/Plan: 1-Acute Stroke: multiple bifrontal, bilateral cerebellar infarcts.  Neurology consulted.  Monitor on telemetry. Family report history of irregular rhythm. Will request records from primary cardiologist.  Discussed with [name] primary oncologist, ok to use any anticoagulation as long platelet count above 50 K.  Will order ECHO, Doppler. PT, ot consult.  LDL elevated, start statins.   2-Brain Mass, Meningioma, less likely dural metastasis.  Metastatic breast cancer Discussed with [name]. He will arrange close follow up with patient. Ok to discharge patient on decadron 8 mg daily.  Ok to resume Ibrance at discharge.   3-HTN; hold diuretic in setting acute stroke.   Code Status: Full Code.  Family Communication: Care discussed with family at bedside.  Disposition Plan: remain inpatient.    Consultants:  Neurology  Procedures:  ECHO; pending.   Doppler  Antibiotics:  none  HPI/Subjective: Patient alert. Complaining of chronic back pain.  Speech is clear. Weakness resolved.   Objective: Filed Vitals:   10/09/14 0551  BP: 129/69  Pulse: 90  Temp: 98 F (36.7 C)  Resp: 17    Intake/Output Summary (Last 24 hours) at 10/09/14 0919 Last data filed at 10/09/14 0600  Gross per 24 hour  Intake 773.75 ml  Output    500 ml  Net 273.75 ml   Filed Weights   10/08/14 1507 10/08/14 2256  Weight: 46.267 kg (102 lb) 47.25 kg (104 lb 2.7 oz)    Exam:   General:  NAD  Cardiovascular: S 1, S 2   Respiratory: CTA  Abdomen: BS present, soft, nt  Musculoskeletal: no edema.   Data Reviewed: Basic Metabolic Panel:  Recent Labs Lab 10/03/14 1359 10/08/14 1442 10/08/14 2317 10/09/14 0502  NA 132* 136  --  134*  K 4.4 4.2  --  4.4  CL 100* 101  --  103  CO2 25 26  --  22  GLUCOSE 138* 126*  --  151*  BUN 19 17  --   15  CREATININE 0.98 1.08* 1.10* 0.87  CALCIUM 8.7* 8.7*  --  7.9*  MG  --   --   --  2.1   Liver Function Tests:  Recent Labs Lab 10/08/14 1442 10/09/14 0502  AST 26 23  ALT 11* 11*  ALKPHOS 65 69  BILITOT 0.3 0.5  PROT 6.7 6.2*  ALBUMIN 3.2* 2.8*   No results for input(s): LIPASE, AMYLASE in the last 168 hours. No results for input(s): AMMONIA in the last 168 hours. CBC:  Recent Labs Lab 10/03/14 1359 10/08/14 1442 10/08/14 2317 10/09/14 0502  WBC 1.7* 3.6 1.8* 2.9*  NEUTROABS 0.8* 1.5  --  2.2  HGB 9.1* 9.5* 8.9* 8.8*  HCT 27.5* 28.1* 26.5* 26.3*  MCV 93 95.1 91.7 91.6  PLT 167 108* 100* 104*   Cardiac Enzymes:  Recent Labs Lab 10/09/14 0502  CKTOTAL 53  CKMB 3.9   BNP (last 3 results) No results for input(s): BNP in the last 8760 hours.  ProBNP (last 3 results) No results for input(s): PROBNP in the last 8760 hours.  CBG:  Recent Labs Lab 10/08/14 1512  GLUCAP 111*    No results found for this or any previous visit (from the past 240 hour(s)).   Studies: Ct Head Wo Contrast  10/08/2014   CLINICAL DATA:  Code stroke, slurred speech, history of metastatic breast cancer  EXAM: CT HEAD  WITHOUT CONTRAST  TECHNIQUE: Contiguous axial images were obtained from the base of the skull through the vertex without intravenous contrast.  COMPARISON:  None.  FINDINGS: Hypodensity in the subcortical left frontal lobe (series 2/image 10), measuring 3.5 x 3.8 cm. While ischemic stroke is possible, the appearance is worrisome for vasogenic edema related to metastasis.  No evidence of parenchymal hemorrhage or extra-axial fluid collection.  No midline shift.  No CT evidence of acute infarction.  Subcortical white matter and periventricular small vessel ischemic changes.  Cerebral volume is within normal limits.  No ventriculomegaly.  The visualized paranasal sinuses are essentially clear. The mastoid air cells are unopacified.  No fracture or dislocation is seen.   IMPRESSION: Subcortical hypodensity in the left frontal lobe, measuring 3.5 x 3.8 cm, worrisome for metastasis, less likely acute ischemic stroke.  Consider MRI brain with contrast for further evaluation as clinically warranted.  These results were called by telephone at the time of interpretation on 10/08/2014 at 3:31 pm to Dr. [name] , who verbally acknowledged these results.   Electronically Signed   By: Julian Hy M.D.   On: 10/08/2014 15:34   Ct Chest W Contrast  10/09/2014   CLINICAL DATA:  Breast cancer.  Weakness.  Skeletal metastasis.  EXAM: CT CHEST, ABDOMEN, AND PELVIS WITH CONTRAST  TECHNIQUE: Multidetector CT imaging of the chest, abdomen and pelvis was performed following the standard protocol during bolus administration of intravenous contrast.  CONTRAST:  90mL OMNIPAQUE IOHEXOL 300 MG/ML  SOLN  COMPARISON:  None.  FINDINGS: CT CHEST FINDINGS  Mediastinum/Nodes: No axillary or supraclavicular lymphadenopathy. No mediastinal hilar lymphadenopathy. No central pulmonary embolism. No pericardial fluid. Esophagus normal.  Lungs/Pleura: Subpleural nodule in the right middle lobe measures 2 mm (image 37, series 3.  CT ABDOMEN AND PELVIS FINDINGS  Hepatobiliary: Hyper enhancing lesion in the subcapsular right hepatic lobe measures 15 mm on image 56, series 2. A second lesion in the right hepatic lobe measures 10 mm on image 54. There are 2 hypervascular lesions in the left hepatic lobe on image 56 and 51. These lesions persists on the delayed imaging suggesting hemangioma. No biliary duct dilatation. Gallbladder is normal.  Pancreas: Pancreas is normal. No ductal dilatation. No pancreatic inflammation.  Spleen: Normal spleen  Adrenals/urinary tract: Adrenal glands and kidneys are normal. The ureters and bladder normal.  Stomach/Bowel: Stomach, small bowel, appendix, and cecum are normal. The colon and rectosigmoid colon are normal. There are diverticula sigmoid colon without acute inflammation.   Vascular/Lymphatic: Abdominal aorta is normal caliber. There is no retroperitoneal or periportal lymphadenopathy. No pelvic lymphadenopathy.  Reproductive: Post hysterectomy anatomy.  No adnexal abnormality.  Musculoskeletal: There multiple sclerotic lesions throughout the pelvis sacrum. Multiple sclerotic lesions throughout the entirety of the cervical, thoracic and lumbar spine. No pathologic fracture addendum  Other: No free fluid.  IMPRESSION: Chest :  1. No evidence of pulmonary metastasis or adenopathy. 2. Small subpleural nodule in the right middle lobe is likely benign.  Abdomen / Pelvis Impression:  1. No evidence of soft tissue metastasis within the abdomen pelvis. 2. Hypervascular lesions within the liver are likely benign flash filling hemangiomas. Further characterization could be achieved with abdominal MRI with without contrast. 3. Widespread sclerotic skeletal metastasis within the spine and pelvis.   Electronically Signed   By: Suzy Bouchard M.D.   On: 10/09/2014 01:33   Mr Jeri Cos DX Contrast  10/08/2014   CLINICAL DATA:  Speech disturbance noted early this afternoon, now resolved.  LEFT-sided weakness, worse than generalized weakness for the last 2 months. History of breast cancer, diffuse metastasis.  EXAM: MRI HEAD WITHOUT CONTRAST  TECHNIQUE: Multiplanar, multiecho pulse sequences of the brain and surrounding structures were obtained without intravenous contrast.  COMPARISON:  CT of the head Oct 08, 2014 at 1519 hours and head CT August 16, 2014  FINDINGS: Low T2, intermediate to low T1 homogeneously enhancing 2 x 2.4 cm mass LEFT inferior frontal lobe, with slight apparent dural tail. Surrounding T2 bright vasogenic edema and local sulcal effacement, without midline shift. No additional suspicious intracranial enhancement.  Multiple subcentimeter foci of reduced diffusion bilateral inferior cerebellum, with corresponding low ADC values. Similar subcentimeter foci and posterior frontal lobes  and parietal lobes bilaterally. Punctate focus of susceptibility artifact in LEFT frontal lobe. The ventricles and sulci are normal for patient's age. Scattered subcentimeter supratentorial white matter FLAIR T2 hyperintensities exclusive of the aforementioned foci of acute ischemia. Tiny bilateral basal ganglia perivascular spaces, unlikely to reflect the lacunar infarcts.  No abnormal extra-axial fluid collections. Normal major intracranial vascular flow voids seen at the skull base. Scattered small mucosal retention cyst without paranasal sinus air-fluid levels. Minimal RIGHT mastoid effusion. Ocular globes and orbital contents appear normal though not tailored for evaluation. No abnormal sellar expansion. No cerebellar tonsillar ectopia. Abnormal low T1 signal in the LEFT frontal calvarium without enhancement, unlikely to reflect metastasis.  IMPRESSION: 2 x 2.4 cm LEFT inferior frontal lobe mass with imaging characteristics of meningioma, less likely dural metastasis with surrounding vasogenic edema.  Multiple acute bifrontal, biparietal and bilateral cerebellar subcentimeter infarcts likely representing embolic phenomena.   Electronically Signed   By: Elon Alas   On: 10/08/2014 22:36   Ct Abdomen Pelvis W Contrast  10/09/2014   CLINICAL DATA:  Breast cancer.  Weakness.  Skeletal metastasis.  EXAM: CT CHEST, ABDOMEN, AND PELVIS WITH CONTRAST  TECHNIQUE: Multidetector CT imaging of the chest, abdomen and pelvis was performed following the standard protocol during bolus administration of intravenous contrast.  CONTRAST:  69mL OMNIPAQUE IOHEXOL 300 MG/ML  SOLN  COMPARISON:  None.  FINDINGS: CT CHEST FINDINGS  Mediastinum/Nodes: No axillary or supraclavicular lymphadenopathy. No mediastinal hilar lymphadenopathy. No central pulmonary embolism. No pericardial fluid. Esophagus normal.  Lungs/Pleura: Subpleural nodule in the right middle lobe measures 2 mm (image 37, series 3.  CT ABDOMEN AND PELVIS FINDINGS   Hepatobiliary: Hyper enhancing lesion in the subcapsular right hepatic lobe measures 15 mm on image 56, series 2. A second lesion in the right hepatic lobe measures 10 mm on image 54. There are 2 hypervascular lesions in the left hepatic lobe on image 56 and 51. These lesions persists on the delayed imaging suggesting hemangioma. No biliary duct dilatation. Gallbladder is normal.  Pancreas: Pancreas is normal. No ductal dilatation. No pancreatic inflammation.  Spleen: Normal spleen  Adrenals/urinary tract: Adrenal glands and kidneys are normal. The ureters and bladder normal.  Stomach/Bowel: Stomach, small bowel, appendix, and cecum are normal. The colon and rectosigmoid colon are normal. There are diverticula sigmoid colon without acute inflammation.  Vascular/Lymphatic: Abdominal aorta is normal caliber. There is no retroperitoneal or periportal lymphadenopathy. No pelvic lymphadenopathy.  Reproductive: Post hysterectomy anatomy.  No adnexal abnormality.  Musculoskeletal: There multiple sclerotic lesions throughout the pelvis sacrum. Multiple sclerotic lesions throughout the entirety of the cervical, thoracic and lumbar spine. No pathologic fracture addendum  Other: No free fluid.  IMPRESSION: Chest :  1. No evidence of pulmonary metastasis or adenopathy. 2. Small subpleural nodule  in the right middle lobe is likely benign.  Abdomen / Pelvis Impression:  1. No evidence of soft tissue metastasis within the abdomen pelvis. 2. Hypervascular lesions within the liver are likely benign flash filling hemangiomas. Further characterization could be achieved with abdominal MRI with without contrast. 3. Widespread sclerotic skeletal metastasis within the spine and pelvis.   Electronically Signed   By: Suzy Bouchard M.D.   On: 10/09/2014 01:33   Dg Chest Portable 1 View  10/08/2014   CLINICAL DATA:  Confusion today. History of metastatic breast cancer. Initial encounter.  EXAM: PORTABLE CHEST - 1 VIEW  COMPARISON:   Radiographs 08/02/2009. Chest CT 08/03/2014 and PET-CT 08/16/2014.  FINDINGS: 1545 hour. The heart size and mediastinal contours are stable. The lungs are clear. There is no pleural effusion or pneumothorax. The known osseous metastases are not well visualized. Telemetry leads overlie the chest. Patient is status post left mastectomy.  IMPRESSION: No active cardiopulmonary process.   Electronically Signed   By: Richardean Sale M.D.   On: 10/08/2014 16:05    Scheduled Meds: . aspirin EC  81 mg Oral Daily  . calcium carbonate  1 tablet Oral Q breakfast  . cholecalciferol  1,000 Units Oral Daily  . dexamethasone  4 mg Intravenous 4 times per day  . enoxaparin (LOVENOX) injection  40 mg Subcutaneous Q24H  . gabapentin  300 mg Oral TID  . pantoprazole  40 mg Oral BID  . simvastatin  10 mg Oral q1800   Continuous Infusions: . sodium chloride 75 mL/hr at 10/08/14 2253    Active Problems:   Breast cancer   Dysphagia, pharyngoesophageal phase   Left hemiparesis   Slurred speech   Bone metastases   Brain metastases   HTN (hypertension)   Mitral valve prolapse   Mitral regurgitation   HLD (hyperlipidemia)   Laryngeal stenosis   Diverticulosis of colon without hemorrhage   Metastatic cancer to brain   Resting tremor    Time spent: 35 minutes.     Niel Hummer A  [facility] Pager [phone]. If 7PM-7AM, please contact night-coverage at [email or web address], password [facility] 10/09/2014, 9:19 AM  LOS: 1 day

## 2014-10-09 NOTE — Progress Notes (Signed)
Swallow evaluation performed by 4N charge nurse.  Patient passed.  Notified Rogue Bussing NP.

## 2014-10-09 NOTE — Progress Notes (Signed)
  2D Echocardiogram has been performed.  Diana Ellis 10/09/2014, 12:03 PM

## 2014-10-09 NOTE — Consult Note (Signed)
Referring Physician: Regalado    Chief Complaint: Stroke  HPI:                                                                                                                                         Diana Ellis is an 81 y.o. female with history of breast cancer with mets to hips an vertebrae -- currently on Ibrance, HTN, MVP, MR, TR, PVCs and HDL. Per son who is in the room, he had spoken to her at 44 and noted she was speaking slow but no slurred words but at 12:45 her other son went over to her house and noted not only slurred words but left leg weakness. They brought her to the hospital. 5 hours later her symptoms cleared. Currently she is having no symptoms. Patient was on ASA in past but was taken off this due to increased risk of bleeding while on Ibrance.  Since in hospital she has been placed on ASA 81 mg daily. PLT at current time is 104.   Date last known well: Date: 10/08/2014 Time last known well: Time: 10:00 tPA Given: No: out of window Modified Rankin: Rankin Score=0   Past Medical History  Diagnosis Date  . Allergy   . Hypertension 2006  . Arthritis   . Bursitis   . Cardiac disease   . Heart murmur   . Cancer   . Malignant neoplasm of breast (female), unspecified site June 21, 2009    T2, N1a, M0; ER 25%, PR 50%, HER-2/neu not over expressed. Mammoprint: High risk for recurrent. Oncology Protocol: Patient is on Research Protocol E 5103 and will receive AC every 3 weeks x 4, then Taxol weekly x 12 treatments and Avastin vs Placebo through the [facility] and Taxol portion of trial.    Past Surgical History  Procedure Laterality Date  . Colonoscopy  2004    Dr Vira Agar  . Tracheal surgery  2012  . Port a cath revision  2011  . Upper gi endoscopy    . Abdominal hysterectomy  1983  . Bronchoscopy  2002  . Port-a-cath removal  2012  . Tonsillectomy  1957  . Breast surgery Left June 21, 2009    MASTECTOMY,, sentinel node biopsy  . Esophageal dilation  07/28/14.     Dr.  Vira Agar    Family History  Problem Relation Age of Onset  . Cancer Mother     lung   Social History:  reports that she has never smoked. She has never used smokeless tobacco. She reports that she does not drink alcohol or use illicit drugs.  Allergies:  Allergies  Allergen Reactions  . Codeine Nausea Only    Medications:  Prior to Admission:  Prescriptions prior to admission  Medication Sig Dispense Refill Last Dose  . acetaminophen (TYLENOL ARTHRITIS PAIN) 650 MG CR tablet Take 1,300 mg by mouth at bedtime.    10/07/2014 at Unknown time  . calcium carbonate (OS-CAL) 600 MG TABS tablet Take 600 mg by mouth daily with breakfast.   10/08/2014 at Unknown time  . cholecalciferol (VITAMIN D) 1000 UNITS tablet Take 1,000 Units by mouth daily.   10/08/2014 at Unknown time  . Fulvestrant (FASLODEX IM) Inject 1 Dose into the muscle every 28 (twenty-eight) days.   09/19/2014  . gabapentin (NEURONTIN) 300 MG capsule Take 300 mg by mouth 3 (three) times daily.   10/08/2014 at Unknown time  . Glucosamine-Chondroit-Vit C-Mn (GLUCOSAMINE 1500 COMPLEX) CAPS Take by mouth daily.   10/08/2014 at Unknown time  . Krill Oil 300 MG CAPS Take 300 mg by mouth daily.   10/08/2014 at Unknown time  . MAGNESIUM ASPARTATE PO Take 1 tablet by mouth daily.   10/08/2014 at Unknown time  . omeprazole (PRILOSEC) 20 MG capsule Take 20 mg by mouth 2 (two) times daily before a meal.   10/08/2014 at Unknown time  . palbociclib (IBRANCE) 125 MG capsule Take 125 mg by mouth daily with lunch. Take whole with food for 21 days.   10/07/2014 at Unknown time  . PRESCRIPTION MEDICATION Inject 1 Dose into the muscle every 14 (fourteen) days.   Past Month at Unknown time  . triamterene-hydrochlorothiazide (MAXZIDE-25) 37.5-25 MG per tablet Take 1 tablet by mouth daily.   10/08/2014 at Unknown time   Scheduled: . aspirin EC  81  mg Oral Daily  . calcium carbonate  1 tablet Oral Q breakfast  . cholecalciferol  1,000 Units Oral Daily  . dexamethasone  4 mg Oral Q12H  . enoxaparin (LOVENOX) injection  40 mg Subcutaneous Q24H  . feeding supplement (ENSURE ENLIVE)  237 mL Oral BID BM  . gabapentin  300 mg Oral TID  . pantoprazole  40 mg Oral BID  . simvastatin  10 mg Oral q1800    ROS:                                                                                                                                       History obtained from the patient  General ROS: negative for - chills, fatigue, fever, night sweats, weight gain or weight loss Psychological ROS: negative for - behavioral disorder, hallucinations, memory difficulties, mood swings or suicidal ideation Ophthalmic ROS: negative for - blurry vision, double vision, eye pain or loss of vision ENT ROS: negative for - epistaxis, nasal discharge, oral lesions, sore throat, tinnitus or vertigo Allergy and Immunology ROS: negative for - hives or itchy/watery eyes Hematological and Lymphatic ROS: negative for - bleeding problems, bruising or swollen lymph nodes Endocrine ROS: negative for - galactorrhea, hair pattern changes, polydipsia/polyuria or temperature intolerance Respiratory ROS: negative for -  cough, hemoptysis, shortness of breath or wheezing Cardiovascular ROS: negative for - chest pain, dyspnea on exertion, edema or irregular heartbeat Gastrointestinal ROS: negative for - abdominal pain, diarrhea, hematemesis, nausea/vomiting or stool incontinence Genito-Urinary ROS: negative for - dysuria, hematuria, incontinence or urinary frequency/urgency Musculoskeletal ROS: negative for - joint swelling or muscular weakness Neurological ROS: as noted in HPI Dermatological ROS: negative for rash and skin lesion changes  Neurologic Examination:                                                                                                      Blood pressure  135/55, pulse 100, temperature 98.6 F (37 C), temperature source Oral, resp. rate 18, height _0  (1.651 m), weight 47.25 kg (104 lb 2.7 oz), SpO2 98 %.  HEENT-  Normocephalic, no lesions, without obvious abnormality.  Normal external eye and conjunctiva.  Normal TM's bilaterally.  Normal auditory canals and external ears. Normal external nose, mucus membranes and septum.  Normal pharynx. Cardiovascular- S1, S2 normal, pulses palpable throughout   Lungs- chest clear, no wheezing, rales, normal symmetric air entry Abdomen- normal findings: bowel sounds normal Extremities- no edema Lymph-no adenopathy palpable Musculoskeletal-no joint tenderness, deformity or swelling Skin-warm and dry, no hyperpigmentation, vitiligo, or suspicious lesions  Neurological Examination Mental Status: Alert, oriented, thought content appropriate.  Speech fluent without evidence of aphasia.  Able to follow 3 step commands without difficulty. Cranial Nerves: II: Discs flat bilaterally; Visual fields grossly normal, pupils equal, round, reactive to light and accommodation III,IV, VI: ptosis not present, extra-ocular motions intact bilaterally V,VII: smile symmetric, facial light touch sensation normal bilaterally VIII: hearing normal bilaterally IX,X: uvula rises symmetrically XI: bilateral shoulder shrug XII: midline tongue extension Motor: Right : Upper extremity   4/5    Left:     Upper extremity   4/5  Lower extremity   4/5     Lower extremity   4/5 Tone and bulk:normal tone throughout; no atrophy noted Sensory: Pinprick and light touch intact throughout, bilaterally Deep Tendon Reflexes: 2+ and symmetric throughout Plantars: Right: downgoing   Left: downgoing Cerebellar: normal finger-to-nose,normal heel-to-shin test Gait: not tested due to safety    Lab Results: Basic Metabolic Panel:  Recent Labs Lab 10/03/14 1359 10/08/14 1442 10/08/14 2317 10/09/14 0502  NA 132* 136  --  134*  K 4.4 4.2   --  4.4  CL 100* 101  --  103  CO2 25 26  --  22  GLUCOSE 138* 126*  --  151*  BUN 19 17  --  15  CREATININE 0.98 1.08* 1.10* 0.87  CALCIUM 8.7* 8.7*  --  7.9*  MG  --   --   --  2.1    Liver Function Tests:  Recent Labs Lab 10/08/14 1442 10/09/14 0502  AST 26 23  ALT 11* 11*  ALKPHOS 65 69  BILITOT 0.3 0.5  PROT 6.7 6.2*  ALBUMIN 3.2* 2.8*   No results for input(s): LIPASE, AMYLASE in the last 168 hours. No results for input(s): AMMONIA in the last 168 hours.  CBC:  Recent Labs Lab 10/03/14 1359 10/08/14 1442 10/08/14 2317 10/09/14 0502  WBC 1.7* 3.6 1.8* 2.9*  NEUTROABS 0.8* 1.5  --  2.2  HGB 9.1* 9.5* 8.9* 8.8*  HCT 27.5* 28.1* 26.5* 26.3*  MCV 93 95.1 91.7 91.6  PLT 167 108* 100* 104*    Cardiac Enzymes:  Recent Labs Lab 10/09/14 0502  CKTOTAL 53  CKMB 3.9    Lipid Panel:  Recent Labs Lab 10/09/14 0502  CHOL 221*  TRIG 111  HDL 57  CHOLHDL 3.9  VLDL 22  LDLCALC 142*    CBG:  Recent Labs Lab 10/08/14 1512  GLUCAP 111*    Microbiology: No results found for this or any previous visit.  Coagulation Studies:  Recent Labs  10/08/14 1442  LABPROT 13.0  INR 0.96    Imaging: Ct Head Wo Contrast  10/08/2014   CLINICAL DATA:  Code stroke, slurred speech, history of metastatic breast cancer  EXAM: CT HEAD WITHOUT CONTRAST  TECHNIQUE: Contiguous axial images were obtained from the base of the skull through the vertex without intravenous contrast.  COMPARISON:  None.  FINDINGS: Hypodensity in the subcortical left frontal lobe (series 2/image 10), measuring 3.5 x 3.8 cm. While ischemic stroke is possible, the appearance is worrisome for vasogenic edema related to metastasis.  No evidence of parenchymal hemorrhage or extra-axial fluid collection.  No midline shift.  No CT evidence of acute infarction.  Subcortical white matter and periventricular small vessel ischemic changes.  Cerebral volume is within normal limits.  No ventriculomegaly.   The visualized paranasal sinuses are essentially clear. The mastoid air cells are unopacified.  No fracture or dislocation is seen.  IMPRESSION: Subcortical hypodensity in the left frontal lobe, measuring 3.5 x 3.8 cm, worrisome for metastasis, less likely acute ischemic stroke.  Consider MRI brain with contrast for further evaluation as clinically warranted.  These results were called by telephone at the time of interpretation on 10/08/2014 at 3:31 pm to Dr. [name] , who verbally acknowledged these results.   Electronically Signed   By: Julian Hy M.D.   On: 10/08/2014 15:34   Ct Chest W Contrast  10/09/2014   CLINICAL DATA:  Breast cancer.  Weakness.  Skeletal metastasis.  EXAM: CT CHEST, ABDOMEN, AND PELVIS WITH CONTRAST  TECHNIQUE: Multidetector CT imaging of the chest, abdomen and pelvis was performed following the standard protocol during bolus administration of intravenous contrast.  CONTRAST:  40m OMNIPAQUE IOHEXOL 300 MG/ML  SOLN  COMPARISON:  None.  FINDINGS: CT CHEST FINDINGS  Mediastinum/Nodes: No axillary or supraclavicular lymphadenopathy. No mediastinal hilar lymphadenopathy. No central pulmonary embolism. No pericardial fluid. Esophagus normal.  Lungs/Pleura: Subpleural nodule in the right middle lobe measures 2 mm (image 37, series 3.  CT ABDOMEN AND PELVIS FINDINGS  Hepatobiliary: Hyper enhancing lesion in the subcapsular right hepatic lobe measures 15 mm on image 56, series 2. A second lesion in the right hepatic lobe measures 10 mm on image 54. There are 2 hypervascular lesions in the left hepatic lobe on image 56 and 51. These lesions persists on the delayed imaging suggesting hemangioma. No biliary duct dilatation. Gallbladder is normal.  Pancreas: Pancreas is normal. No ductal dilatation. No pancreatic inflammation.  Spleen: Normal spleen  Adrenals/urinary tract: Adrenal glands and kidneys are normal. The ureters and bladder normal.  Stomach/Bowel: Stomach, small bowel, appendix,  and cecum are normal. The colon and rectosigmoid colon are normal. There are diverticula sigmoid colon without acute inflammation.  Vascular/Lymphatic: Abdominal aorta is  normal caliber. There is no retroperitoneal or periportal lymphadenopathy. No pelvic lymphadenopathy.  Reproductive: Post hysterectomy anatomy.  No adnexal abnormality.  Musculoskeletal: There multiple sclerotic lesions throughout the pelvis sacrum. Multiple sclerotic lesions throughout the entirety of the cervical, thoracic and lumbar spine. No pathologic fracture addendum  Other: No free fluid.  IMPRESSION: Chest :  1. No evidence of pulmonary metastasis or adenopathy. 2. Small subpleural nodule in the right middle lobe is likely benign.  Abdomen / Pelvis Impression:  1. No evidence of soft tissue metastasis within the abdomen pelvis. 2. Hypervascular lesions within the liver are likely benign flash filling hemangiomas. Further characterization could be achieved with abdominal MRI with without contrast. 3. Widespread sclerotic skeletal metastasis within the spine and pelvis.   Electronically Signed   By: Suzy Bouchard M.D.   On: 10/09/2014 01:33   Mr Jeri Cos TO Contrast  10/08/2014   CLINICAL DATA:  Speech disturbance noted early this afternoon, now resolved. LEFT-sided weakness, worse than generalized weakness for the last 2 months. History of breast cancer, diffuse metastasis.  EXAM: MRI HEAD WITHOUT CONTRAST  TECHNIQUE: Multiplanar, multiecho pulse sequences of the brain and surrounding structures were obtained without intravenous contrast.  COMPARISON:  CT of the head Oct 08, 2014 at 1519 hours and head CT August 16, 2014  FINDINGS: Low T2, intermediate to low T1 homogeneously enhancing 2 x 2.4 cm mass LEFT inferior frontal lobe, with slight apparent dural tail. Surrounding T2 bright vasogenic edema and local sulcal effacement, without midline shift. No additional suspicious intracranial enhancement.  Multiple subcentimeter foci of reduced  diffusion bilateral inferior cerebellum, with corresponding low ADC values. Similar subcentimeter foci and posterior frontal lobes and parietal lobes bilaterally. Punctate focus of susceptibility artifact in LEFT frontal lobe. The ventricles and sulci are normal for patient's age. Scattered subcentimeter supratentorial white matter FLAIR T2 hyperintensities exclusive of the aforementioned foci of acute ischemia. Tiny bilateral basal ganglia perivascular spaces, unlikely to reflect the lacunar infarcts.  No abnormal extra-axial fluid collections. Normal major intracranial vascular flow voids seen at the skull base. Scattered small mucosal retention cyst without paranasal sinus air-fluid levels. Minimal RIGHT mastoid effusion. Ocular globes and orbital contents appear normal though not tailored for evaluation. No abnormal sellar expansion. No cerebellar tonsillar ectopia. Abnormal low T1 signal in the LEFT frontal calvarium without enhancement, unlikely to reflect metastasis.  IMPRESSION: 2 x 2.4 cm LEFT inferior frontal lobe mass with imaging characteristics of meningioma, less likely dural metastasis with surrounding vasogenic edema.  Multiple acute bifrontal, biparietal and bilateral cerebellar subcentimeter infarcts likely representing embolic phenomena.   Electronically Signed   By: Elon Alas   On: 10/08/2014 22:36   Ct Abdomen Pelvis W Contrast  10/09/2014   CLINICAL DATA:  Breast cancer.  Weakness.  Skeletal metastasis.  EXAM: CT CHEST, ABDOMEN, AND PELVIS WITH CONTRAST  TECHNIQUE: Multidetector CT imaging of the chest, abdomen and pelvis was performed following the standard protocol during bolus administration of intravenous contrast.  CONTRAST:  46m OMNIPAQUE IOHEXOL 300 MG/ML  SOLN  COMPARISON:  None.  FINDINGS: CT CHEST FINDINGS  Mediastinum/Nodes: No axillary or supraclavicular lymphadenopathy. No mediastinal hilar lymphadenopathy. No central pulmonary embolism. No pericardial fluid. Esophagus  normal.  Lungs/Pleura: Subpleural nodule in the right middle lobe measures 2 mm (image 37, series 3.  CT ABDOMEN AND PELVIS FINDINGS  Hepatobiliary: Hyper enhancing lesion in the subcapsular right hepatic lobe measures 15 mm on image 56, series 2. A second lesion in the right hepatic lobe  measures 10 mm on image 54. There are 2 hypervascular lesions in the left hepatic lobe on image 56 and 51. These lesions persists on the delayed imaging suggesting hemangioma. No biliary duct dilatation. Gallbladder is normal.  Pancreas: Pancreas is normal. No ductal dilatation. No pancreatic inflammation.  Spleen: Normal spleen  Adrenals/urinary tract: Adrenal glands and kidneys are normal. The ureters and bladder normal.  Stomach/Bowel: Stomach, small bowel, appendix, and cecum are normal. The colon and rectosigmoid colon are normal. There are diverticula sigmoid colon without acute inflammation.  Vascular/Lymphatic: Abdominal aorta is normal caliber. There is no retroperitoneal or periportal lymphadenopathy. No pelvic lymphadenopathy.  Reproductive: Post hysterectomy anatomy.  No adnexal abnormality.  Musculoskeletal: There multiple sclerotic lesions throughout the pelvis sacrum. Multiple sclerotic lesions throughout the entirety of the cervical, thoracic and lumbar spine. No pathologic fracture addendum  Other: No free fluid.  IMPRESSION: Chest :  1. No evidence of pulmonary metastasis or adenopathy. 2. Small subpleural nodule in the right middle lobe is likely benign.  Abdomen / Pelvis Impression:  1. No evidence of soft tissue metastasis within the abdomen pelvis. 2. Hypervascular lesions within the liver are likely benign flash filling hemangiomas. Further characterization could be achieved with abdominal MRI with without contrast. 3. Widespread sclerotic skeletal metastasis within the spine and pelvis.   Electronically Signed   By: Suzy Bouchard M.D.   On: 10/09/2014 01:33   Dg Chest Portable 1 View  10/08/2014    CLINICAL DATA:  Confusion today. History of metastatic breast cancer. Initial encounter.  EXAM: PORTABLE CHEST - 1 VIEW  COMPARISON:  Radiographs 08/02/2009. Chest CT 08/03/2014 and PET-CT 08/16/2014.  FINDINGS: 1545 hour. The heart size and mediastinal contours are stable. The lungs are clear. There is no pleural effusion or pneumothorax. The known osseous metastases are not well visualized. Telemetry leads overlie the chest. Patient is status post left mastectomy.  IMPRESSION: No active cardiopulmonary process.   Electronically Signed   By: Richardean Sale M.D.   On: 10/08/2014 16:05    2 D echo: Study Conclusions  - Left ventricle: The cavity size was normal. Wall thickness was increased in a pattern of mild LVH. Systolic function was vigorous. The estimated ejection fraction was in the range of 65% to 70%. Wall motion was normal; there were no regional wall motion abnormalities. Doppler parameters are consistent with abnormal left ventricular relaxation (grade 1 diastolic dysfunction). - Mitral valve: There was mild regurgitation. - Atrial septum: No defect or patent foramen ovale was identified. - Pulmonary arteries: PA peak pressure: 35 mm Hg (S).  Impressions:  - No cardiac source of emboli was indentified. No PFO.   VASCULAR LAB PRELIMINARY PRELIMINARY PRELIMINARY PRELIMINARY  Carotid Duplex completed.   Preliminary report: Carotid stenosis 1-39% bilaterally. Vertebral arteries are patent and antegrade bilaterally.   Etta Quill PA-C [facility] [phone]  10/09/2014, 1:30 PM   Patient seen and examined.  Clinical course and management discussed.  Necessary edits performed.  I agree with the above.  Assessment and plan of care developed and discussed below.     Assessment: 81 y.o. female presenting with LLE weakness and slurred speech.  Symptoms now improved and patient back to baseline.  Patient with a history of metastatic breast cancer.   Stroke and metastasis in the differential.  MRI of the brain personally reviewed and shows what is felt to be a meningioma and multiple small bilateral acute infarcts.  Stroke work up performed includes a carotid doppler that shows no  hemodynamically significant stenosis and an echocardiogram that shows no intracardiac masses or thrombi and a normal EF.  LDL 142.    Stroke Risk Factors - hypertension   Recommendations: 1.  Statin initiation 2.  ASA daily 3.  Repeat MRI of the brain with and without contrast in 1-2 months to follow up meningioma 4.  A1c   Alexis Goodell, MD [facility] [phone]  10/09/2014  3:54 PM

## 2014-10-09 NOTE — Progress Notes (Signed)
Utilization review complete. Shilo Philipson RN CCM Case Mgmt phone [phone] 

## 2014-10-10 ENCOUNTER — Other Ambulatory Visit: Payer: Self-pay | Admitting: *Deleted

## 2014-10-10 DIAGNOSIS — C50919 Malignant neoplasm of unspecified site of unspecified female breast: Secondary | ICD-10-CM

## 2014-10-10 DIAGNOSIS — I6340 Cerebral infarction due to embolism of unspecified cerebral artery: Secondary | ICD-10-CM | POA: Insufficient documentation

## 2014-10-10 LAB — HEMOGLOBIN A1C
Hgb A1c MFr Bld: 5.7 % — ABNORMAL HIGH (ref 4.8–5.6)
Mean Plasma Glucose: 117 mg/dL

## 2014-10-10 NOTE — Evaluation (Signed)
Physical Therapy Evaluation Patient Details Name: RILYN SCROGGS MRN: [id number] DOB: 13-Aug-1933 Today's Date: 10/10/2014   History of Present Illness  Estefanny ROWAN POLLMAN is an 81 y.o. female with history of breast cancer with mets to hips an vertebrae -- currently on Ibrance, HTN, MVP, MR, TR, PVCs and HDL. Per son who is in the room, he had spoken to her at 2 and noted she was speaking slow but no slurred words but at 12:45 her other son went over to her house and noted not only slurred words but left leg weakness. They brought her to the hospital. 5 hours later her symptoms cleared. Imaging revealed Multiple acute bifrontal, biparietal and bilateral cerebellar as well as meningioma  Clinical Impression   Pt admitted with above diagnosis. Pt currently with functional limitations due to the deficits listed below (see PT Problem List).   Sitting balance - Comments: 2 bouts of sitting EOB; with first sitting trial noted significantly increased L lean (along with LLE weakness compared to RLE), and word finding difficulty; notified RN and son states this was different from earlier today, so assisted pt to supine; Code Stroke activated due to concerns of evolving stroke; once supine, symptoms seemed to clear, so tested a second time; on this bout pt did not have L lean in sitting, but LLE weakness remained; Stroke Team arrived quickly, and Dr. Leonie Man told us that with an acute stroke, hypotension can precipitate appearance or return of symptoms; Unfortunately did not take Orthostatic vitals at time of getting up.  Pt will benefit from skilled PT to increase their independence and safety with mobility to allow discharge to the venue listed below.        Follow Up Recommendations SNF    Equipment Recommendations  Cane    Recommendations for Other Services OT consult;Speech consult     Precautions / Restrictions Precautions Precautions: Fall      Mobility  Bed Mobility Overal bed mobility: Needs  Assistance Bed Mobility: Supine to Sit;Sit to Supine     Supine to sit: Min guard Sit to supine: Min guard   General bed mobility comments: Cues to initiate; noted use of rails; Good reciprocal scoot to EOB  Transfers Overall transfer level: Needs assistance Equipment used: 1 person hand held assist Transfers: Sit to/from Stand Sit to Stand: Min assist         General transfer comment: Min assist to steady  Ambulation/Gait Ambulation/Gait assistance: Min assist Ambulation Distance (Feet): 30 Feet Assistive device: 1 person hand held assist Gait Pattern/deviations: Step-through pattern;Staggering left     General Gait Details: L handheld assist for steadiness; noted some increased L lean after first 15 feet of walking  Stairs            Wheelchair Mobility    Modified Rankin (Stroke Patients Only) Modified Rankin (Stroke Patients Only) Pre-Morbid Rankin Score: No symptoms Modified Rankin: Moderately severe disability     Balance Overall balance assessment: Needs assistance Sitting-balance support: Bilateral upper extremity supported Sitting balance-Leahy Scale: Poor Sitting balance - Comments: 2 bouts of sitting EOB; with first sitting trial noted significantly increased L lean (along with LLE weakness compared to RLE), and word finding difficulty; notified RN and son states this was different from earlier today, so assisted pt to supine; Code Stroke activated; once supine, symptoms seemed to clear, so tested a second time; on this bout pt did not have L lean in sitting, but LLE weakness remained Postural control: Left lateral lean (on  first bout with upright sitting) Standing balance support: Single extremity supported Standing balance-Leahy Scale: Poor Standing balance comment: L lean with fatigue versus possible orthostatic hypotension                             Pertinent Vitals/Pain Pain Assessment: No/denies pain    Home Living  Family/patient expects to be discharged to:: Private residence Living Arrangements: Alone Available Help at Discharge: Family (check in with pt) Type of Home: House Home Access: Stairs to enter Entrance Stairs-Rails: Right Entrance Stairs-Number of Steps: 6 Home Layout: One level Home Equipment: None      Prior Function Level of Independence: Independent         Comments: Son reports only very recent memory issues     Hand Dominance        Extremity/Trunk Assessment   Upper Extremity Assessment: Defer to OT evaluation           Lower Extremity Assessment: LLE deficits/detail   LLE Deficits / Details: Quad 3+/5, ankle dorsiflexion 3+/5  Cervical / Trunk Assessment: Normal  Communication   Communication: Expressive difficulties (word finding difficulty)  Cognition Arousal/Alertness: Awake/alert Behavior During Therapy: WFL for tasks assessed/performed Overall Cognitive Status: Impaired/Different from baseline Area of Impairment: Memory               General Comments: son reports recent memory problems    General Comments      Exercises        Assessment/Plan    PT Assessment Patient needs continued PT services  PT Diagnosis Difficulty walking;Abnormality of gait;Hemiplegia non-dominant side   PT Problem List Decreased strength;Decreased activity tolerance;Decreased balance;Decreased mobility;Decreased coordination;Decreased knowledge of use of DME;Decreased safety awareness;Decreased knowledge of precautions  PT Treatment Interventions DME instruction;Gait training;Stair training;Functional mobility training;Therapeutic activities;Therapeutic exercise;Balance training;Patient/family education   PT Goals (Current goals can be found in the Care Plan section) Acute Rehab PT Goals Patient Stated Goal: would like to go home PT Goal Formulation: With patient Time For Goal Achievement: 10/24/14 Potential to Achieve Goals: Good    Frequency Min  4X/week   Barriers to discharge Decreased caregiver support Must be independent to safely dc hoem    Co-evaluation               End of Session Equipment Utilized During Treatment: Gait belt Activity Tolerance: Other (comment) (Limited by concern over symptoms of stroke) Patient left: in bed;with call bell/phone within reach;with nursing/sitter in room;with family/visitor present (Stroke team present) Nurse Communication: Mobility status;Other (comment) (noted changing signs and symptoms of stroke)         Time: 1345-1415 PT Time Calculation (min) (ACUTE ONLY): 30 min   Charges:   PT Evaluation $Initial PT Evaluation Tier I: 1 Procedure PT Treatments $Gait Training: 8-22 mins   PT G Codes:        Quin Hoop 10/10/2014, 4:26 PM  Roney Marion, [place] Pager [phone] Office [phone]

## 2014-10-10 NOTE — Care Management Note (Signed)
Case Management Note  Patient Details  Name: Diana Ellis MRN: [id number] Date of Birth: Sep 05, 1933  Subjective/Objective:                  CVA, cancer  Action/Plan: SNF, pt lives alone  Expected Discharge Date:  10/10/14               Expected Discharge Plan:  [place]  In-House Referral:  Clinical Social Work  Discharge planning Services  CM Consult     Status of Service:  Completed, signed off  Medicare Important Message Given:  Yes Date Medicare IM Given:  10/10/14 Medicare IM give by:  Jonnie Finner RN CCM  Date Additional Medicare IM Given:    Additional Medicare Important Message give by:     If discussed at [place] of Stay Meetings, dates discussed:    Additional Comments:  NCM spoke to son, Payzlee Ryder [phone], POA. States SNF at [facility] in [place] is choice. CSW referral for SNF.  Waiting PT recommendations. Jonnie Finner RN CCM Case Mgmt phone [phone]  Erenest Rasher, RN 10/10/2014, 10:34 AM

## 2014-10-10 NOTE — Progress Notes (Signed)
Contacted the cardiology group at [facility] to obtain EKG records. Most recent EKG is from April 2015. The person I spoke to stated they would fax the information over to 6E. Awaiting fax currently.  Joellen Jersey, RN.

## 2014-10-10 NOTE — Progress Notes (Addendum)
Initial Nutrition Assessment  DOCUMENTATION CODES:  Underweight  INTERVENTION:  Magic cup, Ensure Enlive (each supplement provides 350kcal and 20 grams of protein)  NUTRITION DIAGNOSIS:  Inadequate oral intake related to cancer and cancer related treatments as evidenced by meal completion < 25%, per patient/family report.   GOAL:  Patient will meet greater than or equal to 90% of their needs   MONITOR:  PO intake, Supplement acceptance, Labs, Weight trends, I & O's, Skin  REASON FOR ASSESSMENT:  Malnutrition Screening Tool    ASSESSMENT: Diana Ellis is a 81 y.o. WF PMHx diagnosed January 2011 breast cancer T2, N1a, M0; ER 25%, PR 50%, HER-2/neu not over expressed. Mammoprint: High risk for recurrent. 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 Tamoxifen stopped and patient started on Ibrance. HTN, mitral valve prolapse, moderate mitral and tricuspid regurgitation and palpitations, PVCs, laryngeal stenosis, HLD, diverticulosis, H. pylori gastritis. [facility] ED with speech disturbance first noted at 12:45 PM by her son when he called her to check on her  Pt admitted with acute stroke. She is scheduled for a TEE tomorrow AM.  Hx obtained mainly by pt's son at bedside, due pt weakness and sleepiness. He reports a general decline in health since January, with the decline severely worsening since March. He reveals that pt has difficulty swallowing in January, which improved once she had a procedure done to remove a blockage from her esophagus. However, since March (when she was subsequently diagnosed with breast cancer), pt son reveals that pt has had an extremely poor appetite and early satiety. He is very involved in her care and encourages her to eat, but only  consumes a few bites. He reports pt has been consuming Ensure supplements at home, but typically only consumes 1-2 per day. Noted that pt consumed about 25% of Ensure shake at bedside. He reveals that pt has also complained of altered taste perception and that she complains that Ensure and many of her favorite foods are now too sweet". Pt son also reveals that pt has gotten progressively weaker due to poor oral intake. Additionally, pt has been experiencing tremors in her hands, making it even more difficult for pt to prepare and eat foods. Pt son reveals breakfast is usually egg and toast and pt often refuses to eat anything after that. He has tried encouraging smaller, more frequent meals.  Discussed strategies to increase calories and protein in diet. Pt son is amenable to continuing Ensure and trying Magic Cups. Discussed nutrition characteristics of supplements to give pt son an estimate of how many calories pt was actually consuming.  CSW following. Likely d/c to SNF.  Labs reviewed. Mean Plasma Glucose: 117. Hgb A1c: 5.7.   Height:  Ht Readings from Last 1 Encounters:  10/09/14 5' 5"  (1.651 m)    Weight:  Wt Readings from Last 1 Encounters:  10/09/14 107 lb 6.5 oz (48.72 kg)    Ideal Body Weight:  56.8 kg  Wt Readings from Last 10 Encounters:  10/09/14 107 lb 6.5 oz (48.72 kg)  08/01/14 110 lb (49.896 kg)  07/20/13 130 lb (58.968 kg)    BMI:  Body mass index is 17.87 kg/(m^2).  Estimated Nutritional Needs:  Kcal:  1450-1650  Protein:  65-75 grams  Fluid:  1.5-1.7 L  Skin:  Reviewed, no issues  Diet Order:  Diet regular Room service appropriate?: Yes; Fluid consistency:: Thin Diet NPO time specified  EDUCATION NEEDS:  Education needs addressed   Intake/Output Summary (Last 24 hours) at 10/10/14 1635 Last data filed at 10/10/14 0626  Gross per 24 hour  Intake   1080 ml  Output    876 ml  Net    204 ml    Last BM:  PTA  Yonathan Perrow A. Jimmye Norman, RD, LDN,  CDE Pager: [phone] After hours Pager: [phone]

## 2014-10-10 NOTE — Progress Notes (Signed)
[facility] PROGRESS NOTE  Diana Ellis ZSW:[id number] DOB: May 28, 1934 DOA: 10/08/2014 PCP: [name], MD  Assessment/Plan: 1-Acute Stroke: multiple bifrontal, bilateral cerebellar infarcts.  Neurology consulted.  Monitor on telemetry. Family report history of irregular rhythm. Will request records from primary cardiologist.  Discussed with [name] primary oncologist, ok to use any anticoagulation as long platelet count above 50 K.  ECHO no source of embolism. , Doppler 1 to 39 % stenosis. PT, ot consult.  LDL elevated, started statins.  Neurology recommending TEE. Arrange for 5-04.   2-Brain Mass, Meningioma, less likely dural metastasis.  Metastatic breast cancer Discussed with [name]. He will arrange close follow up with patient. Ok to discharge patient on decadron 8 mg daily.  Ok to resume Ibrance at discharge.   3-HTN; hold diuretic in setting acute stroke.  4-Pancytopenia; repeat lab sin am.   Code Status: Full Code.  Family Communication: Care discussed with family at bedside.  Disposition Plan: remain inpatient. Need SNF, TEE on 5-4   Consultants:  Neurology  Procedures:  ECHO; no source of stroke.   Doppler no significant stenosis.   Antibiotics:  none  HPI/Subjective: She is in bed, speech clear, no new complaints.  She agree to go to SNF .   Objective: Filed Vitals:   10/10/14 0849  BP: 125/51  Pulse: 95  Temp: 98 F (36.7 C)  Resp: 18    Intake/Output Summary (Last 24 hours) at 10/10/14 1428 Last data filed at 10/10/14 0626  Gross per 24 hour  Intake   1080 ml  Output    876 ml  Net    204 ml   Filed Weights   10/08/14 1507 10/08/14 2256 10/09/14 2115  Weight: 46.267 kg (102 lb) 47.25 kg (104 lb 2.7 oz) 48.72 kg (107 lb 6.5 oz)    Exam:   General:  NAD  Cardiovascular: S 1, S 2   Respiratory: CTA  Abdomen: BS present, soft, nt  Musculoskeletal: no edema.   Data Reviewed: Basic Metabolic  Panel:  Recent Labs Lab 10/08/14 1442 10/08/14 2317 10/09/14 0502  NA 136  --  134*  K 4.2  --  4.4  CL 101  --  103  CO2 26  --  22  GLUCOSE 126*  --  151*  BUN 17  --  15  CREATININE 1.08* 1.10* 0.87  CALCIUM 8.7*  --  7.9*  MG  --   --  2.1   Liver Function Tests:  Recent Labs Lab 10/08/14 1442 10/09/14 0502  AST 26 23  ALT 11* 11*  ALKPHOS 65 69  BILITOT 0.3 0.5  PROT 6.7 6.2*  ALBUMIN 3.2* 2.8*   No results for input(s): LIPASE, AMYLASE in the last 168 hours. No results for input(s): AMMONIA in the last 168 hours. CBC:  Recent Labs Lab 10/08/14 1442 10/08/14 2317 10/09/14 0502  WBC 3.6 1.8* 2.9*  NEUTROABS 1.5  --  2.2  HGB 9.5* 8.9* 8.8*  HCT 28.1* 26.5* 26.3*  MCV 95.1 91.7 91.6  PLT 108* 100* 104*   Cardiac Enzymes:  Recent Labs Lab 10/09/14 0502  CKTOTAL 53  CKMB 3.9   BNP (last 3 results) No results for input(s): BNP in the last 8760 hours.  ProBNP (last 3 results) No results for input(s): PROBNP in the last 8760 hours.  CBG:  Recent Labs Lab 10/08/14 1512  GLUCAP 111*    No results found for this or any previous visit (from the past 240  hour(s)).   Studies: Ct Head Wo Contrast  10/08/2014   CLINICAL DATA:  Code stroke, slurred speech, history of metastatic breast cancer  EXAM: CT HEAD WITHOUT CONTRAST  TECHNIQUE: Contiguous axial images were obtained from the base of the skull through the vertex without intravenous contrast.  COMPARISON:  None.  FINDINGS: Hypodensity in the subcortical left frontal lobe (series 2/image 10), measuring 3.5 x 3.8 cm. While ischemic stroke is possible, the appearance is worrisome for vasogenic edema related to metastasis.  No evidence of parenchymal hemorrhage or extra-axial fluid collection.  No midline shift.  No CT evidence of acute infarction.  Subcortical white matter and periventricular small vessel ischemic changes.  Cerebral volume is within normal limits.  No ventriculomegaly.  The visualized  paranasal sinuses are essentially clear. The mastoid air cells are unopacified.  No fracture or dislocation is seen.  IMPRESSION: Subcortical hypodensity in the left frontal lobe, measuring 3.5 x 3.8 cm, worrisome for metastasis, less likely acute ischemic stroke.  Consider MRI brain with contrast for further evaluation as clinically warranted.  These results were called by telephone at the time of interpretation on 10/08/2014 at 3:31 pm to Dr. [name] , who verbally acknowledged these results.   Electronically Signed   By: Julian Hy M.D.   On: 10/08/2014 15:34   Ct Chest W Contrast  10/09/2014   CLINICAL DATA:  Breast cancer.  Weakness.  Skeletal metastasis.  EXAM: CT CHEST, ABDOMEN, AND PELVIS WITH CONTRAST  TECHNIQUE: Multidetector CT imaging of the chest, abdomen and pelvis was performed following the standard protocol during bolus administration of intravenous contrast.  CONTRAST:  36mL OMNIPAQUE IOHEXOL 300 MG/ML  SOLN  COMPARISON:  None.  FINDINGS: CT CHEST FINDINGS  Mediastinum/Nodes: No axillary or supraclavicular lymphadenopathy. No mediastinal hilar lymphadenopathy. No central pulmonary embolism. No pericardial fluid. Esophagus normal.  Lungs/Pleura: Subpleural nodule in the right middle lobe measures 2 mm (image 37, series 3.  CT ABDOMEN AND PELVIS FINDINGS  Hepatobiliary: Hyper enhancing lesion in the subcapsular right hepatic lobe measures 15 mm on image 56, series 2. A second lesion in the right hepatic lobe measures 10 mm on image 54. There are 2 hypervascular lesions in the left hepatic lobe on image 56 and 51. These lesions persists on the delayed imaging suggesting hemangioma. No biliary duct dilatation. Gallbladder is normal.  Pancreas: Pancreas is normal. No ductal dilatation. No pancreatic inflammation.  Spleen: Normal spleen  Adrenals/urinary tract: Adrenal glands and kidneys are normal. The ureters and bladder normal.  Stomach/Bowel: Stomach, small bowel, appendix, and cecum are  normal. The colon and rectosigmoid colon are normal. There are diverticula sigmoid colon without acute inflammation.  Vascular/Lymphatic: Abdominal aorta is normal caliber. There is no retroperitoneal or periportal lymphadenopathy. No pelvic lymphadenopathy.  Reproductive: Post hysterectomy anatomy.  No adnexal abnormality.  Musculoskeletal: There multiple sclerotic lesions throughout the pelvis sacrum. Multiple sclerotic lesions throughout the entirety of the cervical, thoracic and lumbar spine. No pathologic fracture addendum  Other: No free fluid.  IMPRESSION: Chest :  1. No evidence of pulmonary metastasis or adenopathy. 2. Small subpleural nodule in the right middle lobe is likely benign.  Abdomen / Pelvis Impression:  1. No evidence of soft tissue metastasis within the abdomen pelvis. 2. Hypervascular lesions within the liver are likely benign flash filling hemangiomas. Further characterization could be achieved with abdominal MRI with without contrast. 3. Widespread sclerotic skeletal metastasis within the spine and pelvis.   Electronically Signed   By: Suzy Bouchard  M.D.   On: 10/09/2014 01:33   Mr Kizzie Fantasia Contrast  10/08/2014   CLINICAL DATA:  Speech disturbance noted early this afternoon, now resolved. LEFT-sided weakness, worse than generalized weakness for the last 2 months. History of breast cancer, diffuse metastasis.  EXAM: MRI HEAD WITHOUT CONTRAST  TECHNIQUE: Multiplanar, multiecho pulse sequences of the brain and surrounding structures were obtained without intravenous contrast.  COMPARISON:  CT of the head Oct 08, 2014 at 1519 hours and head CT August 16, 2014  FINDINGS: Low T2, intermediate to low T1 homogeneously enhancing 2 x 2.4 cm mass LEFT inferior frontal lobe, with slight apparent dural tail. Surrounding T2 bright vasogenic edema and local sulcal effacement, without midline shift. No additional suspicious intracranial enhancement.  Multiple subcentimeter foci of reduced diffusion  bilateral inferior cerebellum, with corresponding low ADC values. Similar subcentimeter foci and posterior frontal lobes and parietal lobes bilaterally. Punctate focus of susceptibility artifact in LEFT frontal lobe. The ventricles and sulci are normal for patient's age. Scattered subcentimeter supratentorial white matter FLAIR T2 hyperintensities exclusive of the aforementioned foci of acute ischemia. Tiny bilateral basal ganglia perivascular spaces, unlikely to reflect the lacunar infarcts.  No abnormal extra-axial fluid collections. Normal major intracranial vascular flow voids seen at the skull base. Scattered small mucosal retention cyst without paranasal sinus air-fluid levels. Minimal RIGHT mastoid effusion. Ocular globes and orbital contents appear normal though not tailored for evaluation. No abnormal sellar expansion. No cerebellar tonsillar ectopia. Abnormal low T1 signal in the LEFT frontal calvarium without enhancement, unlikely to reflect metastasis.  IMPRESSION: 2 x 2.4 cm LEFT inferior frontal lobe mass with imaging characteristics of meningioma, less likely dural metastasis with surrounding vasogenic edema.  Multiple acute bifrontal, biparietal and bilateral cerebellar subcentimeter infarcts likely representing embolic phenomena.   Electronically Signed   By: Elon Alas   On: 10/08/2014 22:36   Ct Abdomen Pelvis W Contrast  10/09/2014   CLINICAL DATA:  Breast cancer.  Weakness.  Skeletal metastasis.  EXAM: CT CHEST, ABDOMEN, AND PELVIS WITH CONTRAST  TECHNIQUE: Multidetector CT imaging of the chest, abdomen and pelvis was performed following the standard protocol during bolus administration of intravenous contrast.  CONTRAST:  79mL OMNIPAQUE IOHEXOL 300 MG/ML  SOLN  COMPARISON:  None.  FINDINGS: CT CHEST FINDINGS  Mediastinum/Nodes: No axillary or supraclavicular lymphadenopathy. No mediastinal hilar lymphadenopathy. No central pulmonary embolism. No pericardial fluid. Esophagus normal.   Lungs/Pleura: Subpleural nodule in the right middle lobe measures 2 mm (image 37, series 3.  CT ABDOMEN AND PELVIS FINDINGS  Hepatobiliary: Hyper enhancing lesion in the subcapsular right hepatic lobe measures 15 mm on image 56, series 2. A second lesion in the right hepatic lobe measures 10 mm on image 54. There are 2 hypervascular lesions in the left hepatic lobe on image 56 and 51. These lesions persists on the delayed imaging suggesting hemangioma. No biliary duct dilatation. Gallbladder is normal.  Pancreas: Pancreas is normal. No ductal dilatation. No pancreatic inflammation.  Spleen: Normal spleen  Adrenals/urinary tract: Adrenal glands and kidneys are normal. The ureters and bladder normal.  Stomach/Bowel: Stomach, small bowel, appendix, and cecum are normal. The colon and rectosigmoid colon are normal. There are diverticula sigmoid colon without acute inflammation.  Vascular/Lymphatic: Abdominal aorta is normal caliber. There is no retroperitoneal or periportal lymphadenopathy. No pelvic lymphadenopathy.  Reproductive: Post hysterectomy anatomy.  No adnexal abnormality.  Musculoskeletal: There multiple sclerotic lesions throughout the pelvis sacrum. Multiple sclerotic lesions throughout the entirety of the cervical, thoracic and  lumbar spine. No pathologic fracture addendum  Other: No free fluid.  IMPRESSION: Chest :  1. No evidence of pulmonary metastasis or adenopathy. 2. Small subpleural nodule in the right middle lobe is likely benign.  Abdomen / Pelvis Impression:  1. No evidence of soft tissue metastasis within the abdomen pelvis. 2. Hypervascular lesions within the liver are likely benign flash filling hemangiomas. Further characterization could be achieved with abdominal MRI with without contrast. 3. Widespread sclerotic skeletal metastasis within the spine and pelvis.   Electronically Signed   By: Suzy Bouchard M.D.   On: 10/09/2014 01:33   Dg Chest Portable 1 View  10/08/2014   CLINICAL DATA:   Confusion today. History of metastatic breast cancer. Initial encounter.  EXAM: PORTABLE CHEST - 1 VIEW  COMPARISON:  Radiographs 08/02/2009. Chest CT 08/03/2014 and PET-CT 08/16/2014.  FINDINGS: 1545 hour. The heart size and mediastinal contours are stable. The lungs are clear. There is no pleural effusion or pneumothorax. The known osseous metastases are not well visualized. Telemetry leads overlie the chest. Patient is status post left mastectomy.  IMPRESSION: No active cardiopulmonary process.   Electronically Signed   By: Richardean Sale M.D.   On: 10/08/2014 16:05    Scheduled Meds: . aspirin EC  81 mg Oral Daily  . calcium carbonate  1 tablet Oral Q breakfast  . cholecalciferol  1,000 Units Oral Daily  . dexamethasone  4 mg Oral Q12H  . enoxaparin (LOVENOX) injection  40 mg Subcutaneous Q24H  . feeding supplement (ENSURE ENLIVE)  237 mL Oral BID BM  . gabapentin  300 mg Oral TID  . pantoprazole  40 mg Oral BID  . simvastatin  10 mg Oral q1800   Continuous Infusions: . sodium chloride 75 mL/hr at 10/10/14 0045    Active Problems:   Breast cancer   Dysphagia, pharyngoesophageal phase   Left hemiparesis   Slurred speech   Bone metastases   Brain metastases   HTN (hypertension)   Mitral valve prolapse   Mitral regurgitation   HLD (hyperlipidemia)   Laryngeal stenosis   Diverticulosis of colon without hemorrhage   Metastatic cancer to brain   Resting tremor    Time spent: 35 minutes.     Niel Hummer A  [facility] Pager [phone]. If 7PM-7AM, please contact night-coverage at [email or web address], password [facility] 10/10/2014, 2:28 PM  LOS: 2 days

## 2014-10-10 NOTE — Consult Note (Signed)
    [facility] has been requested to perform a transesophageal echocardiogram on 10/11/14 for acute stroke  After careful review of history and examination, the risks and benefits of transesophageal echocardiogram have been explained including risks of esophageal damage, perforation (1:10,000 risk), bleeding, pharyngeal hematoma as well as other potential complications associated with conscious sedation including aspiration, arrhythmia, respiratory failure and death. Alternatives to treatment were discussed, questions were answered. Patient is willing to proceed.  Son with her for discussion as well.  Will check platelets in AM.  INGOLD,LAURA R, NP 10/10/2014 3:58 PM

## 2014-10-10 NOTE — Progress Notes (Signed)
STROKE TEAM PROGRESS NOTE   HISTORY MEYLI BOICE is an 81 y.o. female with history of breast cancer with mets to hips an vertebrae -- currently on Ibrance, HTN, MVP, MR, TR, PVCs and HDL. Per son who is in the room, he had spoken to her at 1000 (LKW 10/08/2014 at 1000) and noted she was speaking slow but no slurred words but at 12:45 her other son went over to her house and noted not only slurred words but left leg weakness. They brought her to the hospital. 5 hours later her symptoms cleared. Currently she is having no symptoms. Patient was on ASA in past but was taken off this due to increased risk of bleeding while on Ibrance. Since in hospital she has been placed on ASA 81 mg daily. PLT at current time is 104. Modified Rankin: Rankin Score=0. Patient was not administered TPA secondary to delay in arrival. She was admitted for further evaluation and treatment.   SUBJECTIVE (INTERVAL HISTORY) Her family is at the bedside.  Overall she feels her condition is gradually worsening. She just experience an episode of neurologic worsening, leaning to the left, while working with PT. She is back in bed now and symptoms have improved.   OBJECTIVE Temp:  [98 F (36.7 C)-98.2 F (36.8 C)] 98 F (36.7 C) (05/03 0849) Pulse Rate:  [88-95] 95 (05/03 0849) Cardiac Rhythm:  [-] Normal sinus rhythm (05/03 0901) Resp:  [17-18] 18 (05/03 0849) BP: (124-151)/(51-71) 125/51 mmHg (05/03 0849) SpO2:  [98 %-99 %] 99 % (05/03 0849) Weight:  [48.72 kg (107 lb 6.5 oz)] 48.72 kg (107 lb 6.5 oz) (05/02 2115)   Recent Labs Lab 10/08/14 1512  GLUCAP 111*    Recent Labs Lab 10/08/14 1442 10/08/14 2317 10/09/14 0502  NA 136  --  134*  K 4.2  --  4.4  CL 101  --  103  CO2 26  --  22  GLUCOSE 126*  --  151*  BUN 17  --  15  CREATININE 1.08* 1.10* 0.87  CALCIUM 8.7*  --  7.9*  MG  --   --  2.1    Recent Labs Lab 10/08/14 1442 10/09/14 0502  AST 26 23  ALT 11* 11*  ALKPHOS 65 69  BILITOT 0.3 0.5   PROT 6.7 6.2*  ALBUMIN 3.2* 2.8*    Recent Labs Lab 10/08/14 1442 10/08/14 2317 10/09/14 0502  WBC 3.6 1.8* 2.9*  NEUTROABS 1.5  --  2.2  HGB 9.5* 8.9* 8.8*  HCT 28.1* 26.5* 26.3*  MCV 95.1 91.7 91.6  PLT 108* 100* 104*    Recent Labs Lab 10/09/14 0502  CKTOTAL 53  CKMB 3.9    Recent Labs  10/08/14 1442  LABPROT 13.0  INR 0.96    Recent Labs  10/08/14 1442  COLORURINE STRAW*  LABSPEC 1.009  PHURINE 8.0  GLUCOSEU NEGATIVE  HGBUR NEGATIVE  BILIRUBINUR NEGATIVE  KETONESUR NEGATIVE  PROTEINUR NEGATIVE  NITRITE NEGATIVE  LEUKOCYTESUR NEGATIVE       Component Value Date/Time   CHOL 221* 10/09/2014 0502   TRIG 111 10/09/2014 0502   HDL 57 10/09/2014 0502   CHOLHDL 3.9 10/09/2014 0502   VLDL 22 10/09/2014 0502   LDLCALC 142* 10/09/2014 0502   Lab Results  Component Value Date   HGBA1C 5.7* 10/09/2014   No results found for: LABOPIA, COCAINSCRNUR, LABBENZ, AMPHETMU, THCU, LABBARB  No results for input(s): ETH in the last 168 hours.  Ct Head Wo Contrast  10/08/2014  CLINICAL DATA:  Code stroke, slurred speech, history of metastatic breast cancer  EXAM: CT HEAD WITHOUT CONTRAST  TECHNIQUE: Contiguous axial images were obtained from the base of the skull through the vertex without intravenous contrast.  COMPARISON:  None.  FINDINGS: Hypodensity in the subcortical left frontal lobe (series 2/image 10), measuring 3.5 x 3.8 cm. While ischemic stroke is possible, the appearance is worrisome for vasogenic edema related to metastasis.  No evidence of parenchymal hemorrhage or extra-axial fluid collection.  No midline shift.  No CT evidence of acute infarction.  Subcortical white matter and periventricular small vessel ischemic changes.  Cerebral volume is within normal limits.  No ventriculomegaly.  The visualized paranasal sinuses are essentially clear. The mastoid air cells are unopacified.  No fracture or dislocation is seen.  IMPRESSION: Subcortical hypodensity in  the left frontal lobe, measuring 3.5 x 3.8 cm, worrisome for metastasis, less likely acute ischemic stroke.  Consider MRI brain with contrast for further evaluation as clinically warranted.  These results were called by telephone at the time of interpretation on 10/08/2014 at 3:31 pm to Dr. [name] , who verbally acknowledged these results.   Electronically Signed   By: Julian Hy M.D.   On: 10/08/2014 15:34   Ct Chest W Contrast  10/09/2014   CLINICAL DATA:  Breast cancer.  Weakness.  Skeletal metastasis.  EXAM: CT CHEST, ABDOMEN, AND PELVIS WITH CONTRAST  TECHNIQUE: Multidetector CT imaging of the chest, abdomen and pelvis was performed following the standard protocol during bolus administration of intravenous contrast.  CONTRAST:  109mL OMNIPAQUE IOHEXOL 300 MG/ML  SOLN  COMPARISON:  None.  FINDINGS: CT CHEST FINDINGS  Mediastinum/Nodes: No axillary or supraclavicular lymphadenopathy. No mediastinal hilar lymphadenopathy. No central pulmonary embolism. No pericardial fluid. Esophagus normal.  Lungs/Pleura: Subpleural nodule in the right middle lobe measures 2 mm (image 37, series 3.  CT ABDOMEN AND PELVIS FINDINGS  Hepatobiliary: Hyper enhancing lesion in the subcapsular right hepatic lobe measures 15 mm on image 56, series 2. A second lesion in the right hepatic lobe measures 10 mm on image 54. There are 2 hypervascular lesions in the left hepatic lobe on image 56 and 51. These lesions persists on the delayed imaging suggesting hemangioma. No biliary duct dilatation. Gallbladder is normal.  Pancreas: Pancreas is normal. No ductal dilatation. No pancreatic inflammation.  Spleen: Normal spleen  Adrenals/urinary tract: Adrenal glands and kidneys are normal. The ureters and bladder normal.  Stomach/Bowel: Stomach, small bowel, appendix, and cecum are normal. The colon and rectosigmoid colon are normal. There are diverticula sigmoid colon without acute inflammation.  Vascular/Lymphatic: Abdominal aorta is  normal caliber. There is no retroperitoneal or periportal lymphadenopathy. No pelvic lymphadenopathy.  Reproductive: Post hysterectomy anatomy.  No adnexal abnormality.  Musculoskeletal: There multiple sclerotic lesions throughout the pelvis sacrum. Multiple sclerotic lesions throughout the entirety of the cervical, thoracic and lumbar spine. No pathologic fracture addendum  Other: No free fluid.  IMPRESSION: Chest :  1. No evidence of pulmonary metastasis or adenopathy. 2. Small subpleural nodule in the right middle lobe is likely benign.  Abdomen / Pelvis Impression:  1. No evidence of soft tissue metastasis within the abdomen pelvis. 2. Hypervascular lesions within the liver are likely benign flash filling hemangiomas. Further characterization could be achieved with abdominal MRI with without contrast. 3. Widespread sclerotic skeletal metastasis within the spine and pelvis.   Electronically Signed   By: Suzy Bouchard M.D.   On: 10/09/2014 01:33   Mr Jeri Cos SH  Contrast  10/08/2014   CLINICAL DATA:  Speech disturbance noted early this afternoon, now resolved. LEFT-sided weakness, worse than generalized weakness for the last 2 months. History of breast cancer, diffuse metastasis.  EXAM: MRI HEAD WITHOUT CONTRAST  TECHNIQUE: Multiplanar, multiecho pulse sequences of the brain and surrounding structures were obtained without intravenous contrast.  COMPARISON:  CT of the head Oct 08, 2014 at 1519 hours and head CT August 16, 2014  FINDINGS: Low T2, intermediate to low T1 homogeneously enhancing 2 x 2.4 cm mass LEFT inferior frontal lobe, with slight apparent dural tail. Surrounding T2 bright vasogenic edema and local sulcal effacement, without midline shift. No additional suspicious intracranial enhancement.  Multiple subcentimeter foci of reduced diffusion bilateral inferior cerebellum, with corresponding low ADC values. Similar subcentimeter foci and posterior frontal lobes and parietal lobes bilaterally. Punctate  focus of susceptibility artifact in LEFT frontal lobe. The ventricles and sulci are normal for patient's age. Scattered subcentimeter supratentorial white matter FLAIR T2 hyperintensities exclusive of the aforementioned foci of acute ischemia. Tiny bilateral basal ganglia perivascular spaces, unlikely to reflect the lacunar infarcts.  No abnormal extra-axial fluid collections. Normal major intracranial vascular flow voids seen at the skull base. Scattered small mucosal retention cyst without paranasal sinus air-fluid levels. Minimal RIGHT mastoid effusion. Ocular globes and orbital contents appear normal though not tailored for evaluation. No abnormal sellar expansion. No cerebellar tonsillar ectopia. Abnormal low T1 signal in the LEFT frontal calvarium without enhancement, unlikely to reflect metastasis.  IMPRESSION: 2 x 2.4 cm LEFT inferior frontal lobe mass with imaging characteristics of meningioma, less likely dural metastasis with surrounding vasogenic edema.  Multiple acute bifrontal, biparietal and bilateral cerebellar subcentimeter infarcts likely representing embolic phenomena.   Electronically Signed   By: Elon Alas   On: 10/08/2014 22:36   Ct Abdomen Pelvis W Contrast  10/09/2014   CLINICAL DATA:  Breast cancer.  Weakness.  Skeletal metastasis.  EXAM: CT CHEST, ABDOMEN, AND PELVIS WITH CONTRAST  TECHNIQUE: Multidetector CT imaging of the chest, abdomen and pelvis was performed following the standard protocol during bolus administration of intravenous contrast.  CONTRAST:  46mL OMNIPAQUE IOHEXOL 300 MG/ML  SOLN  COMPARISON:  None.  FINDINGS: CT CHEST FINDINGS  Mediastinum/Nodes: No axillary or supraclavicular lymphadenopathy. No mediastinal hilar lymphadenopathy. No central pulmonary embolism. No pericardial fluid. Esophagus normal.  Lungs/Pleura: Subpleural nodule in the right middle lobe measures 2 mm (image 37, series 3.  CT ABDOMEN AND PELVIS FINDINGS  Hepatobiliary: Hyper enhancing lesion  in the subcapsular right hepatic lobe measures 15 mm on image 56, series 2. A second lesion in the right hepatic lobe measures 10 mm on image 54. There are 2 hypervascular lesions in the left hepatic lobe on image 56 and 51. These lesions persists on the delayed imaging suggesting hemangioma. No biliary duct dilatation. Gallbladder is normal.  Pancreas: Pancreas is normal. No ductal dilatation. No pancreatic inflammation.  Spleen: Normal spleen  Adrenals/urinary tract: Adrenal glands and kidneys are normal. The ureters and bladder normal.  Stomach/Bowel: Stomach, small bowel, appendix, and cecum are normal. The colon and rectosigmoid colon are normal. There are diverticula sigmoid colon without acute inflammation.  Vascular/Lymphatic: Abdominal aorta is normal caliber. There is no retroperitoneal or periportal lymphadenopathy. No pelvic lymphadenopathy.  Reproductive: Post hysterectomy anatomy.  No adnexal abnormality.  Musculoskeletal: There multiple sclerotic lesions throughout the pelvis sacrum. Multiple sclerotic lesions throughout the entirety of the cervical, thoracic and lumbar spine. No pathologic fracture addendum  Other: No free fluid.  IMPRESSION: Chest :  1. No evidence of pulmonary metastasis or adenopathy. 2. Small subpleural nodule in the right middle lobe is likely benign.  Abdomen / Pelvis Impression:  1. No evidence of soft tissue metastasis within the abdomen pelvis. 2. Hypervascular lesions within the liver are likely benign flash filling hemangiomas. Further characterization could be achieved with abdominal MRI with without contrast. 3. Widespread sclerotic skeletal metastasis within the spine and pelvis.   Electronically Signed   By: Suzy Bouchard M.D.   On: 10/09/2014 01:33   Dg Chest Portable 1 View  10/08/2014   CLINICAL DATA:  Confusion today. History of metastatic breast cancer. Initial encounter.  EXAM: PORTABLE CHEST - 1 VIEW  COMPARISON:  Radiographs 08/02/2009. Chest CT 08/03/2014  and PET-CT 08/16/2014.  FINDINGS: 1545 hour. The heart size and mediastinal contours are stable. The lungs are clear. There is no pleural effusion or pneumothorax. The known osseous metastases are not well visualized. Telemetry leads overlie the chest. Patient is status post left mastectomy.  IMPRESSION: No active cardiopulmonary process.   Electronically Signed   By: Richardean Sale M.D.   On: 10/08/2014 16:05   Carotid Doppler  There is 1-39% bilateral ICA stenosis. Vertebral artery flow is antegrade.    2D Echocardiogram   - Left ventricle: The cavity size was normal. Wall thickness was increased in a pattern of mild LVH. Systolic function wasvigorous. The estimated ejection fraction was in the range of 65%to 70%. Wall motion was normal; there were no regional wallmotion abnormalities. Doppler parameters are consistent withabnormal left ventricular relaxation (grade 1 diastolicdysfunction). - Mitral valve: There was mild regurgitation. - Atrial septum: No defect or patent foramen ovale was identified. - Pulmonary arteries: PA peak pressure: 35 mm Hg (S). Impressions: No cardiac source of emboli was indentified.   PHYSICAL EXAM Frail elderly lady not in distress. . Afebrile. Head is nontraumatic. Neck is supple without bruit.    Cardiac exam no murmur or gallop. Lungs are clear to auscultation. Distal pulses are well felt. Neurological Exam ; Awake alert oriented x 3 normal speech and language. Mild left lower face asymmetry. Tongue midline. No drift. Mild diminished fine finger movements on left. Orbits right over left upper extremity. Mild left grip and hip flexor weak.. Normal sensation . Normal coordination.gait deferred  ASSESSMENT/PLAN Ms. GEORGIANN NEIDER is a 81 y.o. female with history of breast cancer with mets to hips an vertebrae -- currently on Ibrance, HTN, MVP, MR, TR, PVCs and HDL presenting with slurred speech and left leg weakness. She did not receive IV t-PA due to delay in  arrival.   Stroke:  bilateral frontal, parietal and cerebellar infarcts, appear embolic secondary to unknown source. Likely cardiogenic source vs hypercoagulable from breast cancer  Resultant  Left hemiparesis, expressive aphasia  MRI   2 x 2.4 cm LEFT inferior frontal lobe mass with imaging characteristics of meningioma, less likely dural metastasis with surrounding vasogenic edema.  Multiple acute bifrontal, biparietal and bilateral cerebellar subcentimeter infarcts likely representing embolic phenomena  MRA  Not done  Carotid Doppler  No significant stenosis   2D Echo  No source of embolus  TEE to look for embolic source. Arranged with Ollie for tomorrow.  If positive for PFO (patent foramen ovale), check bilateral lower extremity venous dopplers to rule out DVT as possible source of stroke. (I have made patient NPO after midnight tonight).  Will not consider for loop as not a good long-term anticoagulation candidate  HgbA1c 5.7  Lovenox 40 mg sq daily for VTE prophylaxis  Diet regular Room service appropriate?: Yes; Fluid consistency:: Thin  no antithrombotic prior to admission, now on aspirin 81 mg orally every day.   Therapy recommendations:  SNF  Disposition:  pending   Breast Cancer with mets to hips and vertebrae  Hx breast cancer 2011 with high risk of recurrence  No brain mets seen on MRI  Meningioma  Asymptomatic  Incidental finding  No intervention indicated at this time  Hypertension  Unstable  Hyperlipidemia  Home meds:  No statin   LDL 142, goal < 70  Recommend Addition of statin   Other Stroke Risk Factors  Advanced age  Coronary artery disease  Other Active Problems  Pancytopenia  Recent EGD w/ esophageal dilatation  Hospital day # [place] for Pager information 10/10/2014 5:21 PM  I have personally examined this patient, reviewed notes, independently viewed  imaging studies, participated in medical decision making and plan of care. I have made any additions or clarifications directly to the above note. Agree with note above.  She presented with transient speech difficulty and left leg weakness but had improved but had transient worsening this afternoon when she stood up with physical therapy likely due to orthostasis and is again improving with bed rst and hydration. MRI shows multiple small embolic infarcts but no metastasis.she is at high risk for recurrent stroke/TIA and neurological worsen Recommend bedrest for today And hydration. Continue ongoing stroke evaluation. Long discussion with patient and multiple family members at the bedside and answered questions  Antony Contras, [place] Pager: [phone] 10/10/2014 10:35 PM    To contact Stroke Continuity provider, please refer to [email or web address] After hours, contact General Neurology

## 2014-10-11 ENCOUNTER — Inpatient Hospital Stay (HOSPITAL_COMMUNITY): Payer: Medicare Other

## 2014-10-11 ENCOUNTER — Encounter (HOSPITAL_COMMUNITY): Payer: Self-pay | Admitting: *Deleted

## 2014-10-11 ENCOUNTER — Other Ambulatory Visit: Payer: Self-pay

## 2014-10-11 ENCOUNTER — Encounter (HOSPITAL_COMMUNITY)
Admission: EM | Disposition: A | Discharge status: Skilled Nursing Facility | Payer: Self-pay | Source: Home / Self Care | Attending: Internal Medicine

## 2014-10-11 DIAGNOSIS — I340 Nonrheumatic mitral (valve) insufficiency: Secondary | ICD-10-CM

## 2014-10-11 HISTORY — PX: TEE WITHOUT CARDIOVERSION: SHX5443

## 2014-10-11 LAB — CBC
HEMATOCRIT: 22 % — AB (ref 36.0–46.0)
HEMOGLOBIN: 7.4 g/dL — AB (ref 12.0–15.0)
MCH: 31.4 pg (ref 26.0–34.0)
MCHC: 33.6 g/dL (ref 30.0–36.0)
MCV: 93.2 fL (ref 78.0–100.0)
Platelets: 90 10*3/uL — ABNORMAL LOW (ref 150–400)
RBC: 2.36 MIL/uL — AB (ref 3.87–5.11)
RDW: 22.9 % — ABNORMAL HIGH (ref 11.5–15.5)
WBC: 3.2 10*3/uL — ABNORMAL LOW (ref 4.0–10.5)

## 2014-10-11 LAB — BASIC METABOLIC PANEL
ANION GAP: 8 (ref 5–15)
BUN: 20 mg/dL (ref 6–20)
CHLORIDE: 105 mmol/L (ref 101–111)
CO2: 21 mmol/L — AB (ref 22–32)
Calcium: 7.5 mg/dL — ABNORMAL LOW (ref 8.9–10.3)
Creatinine, Ser: 0.79 mg/dL (ref 0.44–1.00)
GFR calc Af Amer: >60 mL/min (ref >60)
GFR calc non Af Amer: >60 mL/min (ref >60)
Glucose, Bld: 126 mg/dL — ABNORMAL HIGH (ref 70–99)
Potassium: 4 mmol/L (ref 3.5–5.1)
Sodium: 134 mmol/L — ABNORMAL LOW (ref 135–145)

## 2014-10-11 SURGERY — ECHOCARDIOGRAM, TRANSESOPHAGEAL
Anesthesia: Moderate Sedation

## 2014-10-11 MED ORDER — FENTANYL CITRATE (PF) 100 MCG/2ML IJ SOLN
INTRAMUSCULAR | Status: AC
  Filled 2014-10-11: qty 2

## 2014-10-11 MED ORDER — FENTANYL CITRATE (PF) 100 MCG/2ML IJ SOLN
INTRAMUSCULAR | Status: DC | PRN
  Administered 2014-10-11: 25 ug via INTRAVENOUS
  Administered 2014-10-11: 12.5 ug via INTRAVENOUS

## 2014-10-11 MED ORDER — BUTAMBEN-TETRACAINE-BENZOCAINE 2-2-14 % EX AERO
INHALATION_SPRAY | CUTANEOUS | Status: DC | PRN
  Administered 2014-10-11: 2 via TOPICAL

## 2014-10-11 MED ORDER — MIDAZOLAM HCL 5 MG/ML IJ SOLN
INTRAMUSCULAR | Status: AC
Start: 2014-10-11 — End: 2014-10-11
  Filled 2014-10-11: qty 2

## 2014-10-11 MED ORDER — SODIUM CHLORIDE 0.9 % IV SOLN
INTRAVENOUS | Status: DC
  Administered 2014-10-11: 11:00:00 via INTRAVENOUS

## 2014-10-11 MED ORDER — MIDAZOLAM HCL 10 MG/2ML IJ SOLN
INTRAMUSCULAR | Status: DC | PRN
  Administered 2014-10-11 (×2): 1 mg via INTRAVENOUS

## 2014-10-11 NOTE — Progress Notes (Signed)
[facility] PROGRESS NOTE  Diana Ellis WUJ:[id number] DOB: 01-Jun-1934 DOA: 10/08/2014 PCP: [name], MD  Assessment/Plan: 1-Acute Stroke: multiple bifrontal, bilateral cerebellar infarcts.  -Embolic Neurology following Monitor on telemetry, Pt and Family report history of irregular rhythm. Requested records from primary cardiologist [facility] Suspect that she will be a poor anticoagulation candidate due to 2cm meningioma with vasogenic edema, now on ASA 81mg  ECHO no source of embolism. , Doppler 1 to 39 % stenosis. PT, Pt following, SNF recommended LDL elevated, started statins.  TEE today  2-Brain Mass, Meningioma, less likely dural metastasis.  Metastatic breast cancer Dr.Regelado discussed with [name]. He will arrange close follow up with patient. Ok to discharge patient on decadron 8 mg daily.  Ok to resume Ibrance at discharge.   3-HTN; held diuretic in setting acute stroke.   4-Pancytopenia; due to chemo/Ibrance -Hb 7.4 this am, repeat tomorrow -transfuse if Hb <7  Code Status: Full Code.  Family Communication: Care discussed with family at bedside.  Disposition Plan: remain inpatient. Need SNF, TEE on 5-4   Consultants:  Neurology  Procedures:  ECHO; no source of stroke.   Doppler no significant stenosis.   Antibiotics:  none  HPI/Subjective: Speech unchanged  Objective: Filed Vitals:   10/11/14 0405  BP: 148/67  Pulse: 104  Temp: 97.8 F (36.6 C)  Resp: 16    Intake/Output Summary (Last 24 hours) at 10/11/14 0802 Last data filed at 10/11/14 0600  Gross per 24 hour  Intake   1010 ml  Output    900 ml  Net    110 ml   Filed Weights   10/08/14 2256 10/09/14 2115 10/10/14 2124  Weight: 47.25 kg (104 lb 2.7 oz) 48.72 kg (107 lb 6.5 oz) 49.52 kg (109 lb 2.8 oz)    Exam:   General:  NAD, AAOx3, weak appearing  Cardiovascular: S 1, S 2   Respiratory: CTA  Abdomen: BS present, soft, nt  Musculoskeletal: no  edema.   Neuro: non focal  Data Reviewed: Basic Metabolic Panel:  Recent Labs Lab 10/08/14 1442 10/08/14 2317 10/09/14 0502 10/11/14 0531  NA 136  --  134* 134*  K 4.2  --  4.4 4.0  CL 101  --  103 105  CO2 26  --  22 21*  GLUCOSE 126*  --  151* 126*  BUN 17  --  15 20  CREATININE 1.08* 1.10* 0.87 0.79  CALCIUM 8.7*  --  7.9* 7.5*  MG  --   --  2.1  --    Liver Function Tests:  Recent Labs Lab 10/08/14 1442 10/09/14 0502  AST 26 23  ALT 11* 11*  ALKPHOS 65 69  BILITOT 0.3 0.5  PROT 6.7 6.2*  ALBUMIN 3.2* 2.8*   No results for input(s): LIPASE, AMYLASE in the last 168 hours. No results for input(s): AMMONIA in the last 168 hours. CBC:  Recent Labs Lab 10/08/14 1442 10/08/14 2317 10/09/14 0502 10/11/14 0531  WBC 3.6 1.8* 2.9* 3.2*  NEUTROABS 1.5  --  2.2  --   HGB 9.5* 8.9* 8.8* 7.4*  HCT 28.1* 26.5* 26.3* 22.0*  MCV 95.1 91.7 91.6 93.2  PLT 108* 100* 104* 90*   Cardiac Enzymes:  Recent Labs Lab 10/09/14 0502  CKTOTAL 53  CKMB 3.9   BNP (last 3 results) No results for input(s): BNP in the last 8760 hours.  ProBNP (last 3 results) No results for input(s): PROBNP in the last 8760 hours.  CBG:  Recent Labs Lab 10/08/14 1512  GLUCAP 111*    No results found for this or any previous visit (from the past 240 hour(s)).   Studies: No results found.  Scheduled Meds: . aspirin EC  81 mg Oral Daily  . calcium carbonate  1 tablet Oral Q breakfast  . cholecalciferol  1,000 Units Oral Daily  . dexamethasone  4 mg Oral Q12H  . enoxaparin (LOVENOX) injection  40 mg Subcutaneous Q24H  . feeding supplement (ENSURE ENLIVE)  237 mL Oral BID BM  . gabapentin  300 mg Oral TID  . pantoprazole  40 mg Oral BID  . simvastatin  10 mg Oral q1800   Continuous Infusions: . sodium chloride 50 mL/hr (10/10/14 1436)    Active Problems:   Breast cancer   Dysphagia, pharyngoesophageal phase   Left hemiparesis   Slurred speech   Bone metastases   Brain  metastases   HTN (hypertension)   Mitral valve prolapse   Mitral regurgitation   HLD (hyperlipidemia)   Laryngeal stenosis   Diverticulosis of colon without hemorrhage   Metastatic cancer to brain   Resting tremor   Cerebral embolism with cerebral infarction    Time spent: 35 minutes.     [facility]  [facility] Pager [phone]. If 7PM-7AM, please contact night-coverage at [email or web address], password [facility] 10/11/2014, 8:02 AM  LOS: 3 days

## 2014-10-11 NOTE — Clinical Social Work Note (Signed)
Clinical Social Work Assessment  Patient Details  Name: Diana Ellis MRN: [id number] Date of Birth: [birth year]  Date of referral:  10/10/14               Reason for consult:  Facility Placement                Permission sought to share information with:    Permission granted to share information::  Yes, Verbal Permission Granted  Name::     Thersa Salt, son (Per son, he is Economist)  Agency::     Relationship::     Contact Information:     Housing/Transportation Living arrangements for the past 2 months:  Single Family Home Source of Information:  Adult Children (Son Thersa Salt) Patient Interpreter Needed:  None Criminal Activity/Legal Involvement Pertinent to Current Situation/Hospitalization:  No - Comment as needed Significant Relationships:  Adult Children (Sons Marya Amsler and Jenah Vanasten and their families) Lives with:  Self Do you feel safe going back to the place where you live?  No Need for family participation in patient care:  Yes (Comment)  Care giving concerns: Patient lives alone.    Social Worker assessment / plan:  On 10/10/14, CSW talked with patient and son regarding d/c planning and MD recommendation of ST rehab. Patient was awake, alert, acknowledged CSW, however she allowed her son Shataria Crist to take the lead in the conversation. Patient would nod her head in agreement or smile (appropriately) during the conversation. Mr. Slager stated that patient lives alone and cannot d/c home form hospital as she is very weak. He also gave CSW a brief medical history of patient. He and his brother and patient are in agreement with rehab and facility preference is [facility] in [place]. CSW explained facility search process and advised son that they will be kept updated on facility responses.  Employment status:  Retired Health visitor, Managed Care PT Recommendations:  [place] / Referral to community resources:   None requested at this  time.  Patient/Family's Response to care:  Mrs. Port and sons are in agreement with rehab at a skilled facility  Patient/Family's Understanding of and Emotional Response to Diagnosis, Current Treatment, and Prognosis:  Family aware that patient is ill (cancer patient on medications) and that she had a stroke prior to admission and during this admission.  Son very matter-of-fact in his discussion of patient's health and medical treatments.   Emotional Assessment Appearance:  Appears stated age Attitude/Demeanor/Rapport:  Other (Appropriate for interaction) Affect (typically observed):  Appropriate, Calm Orientation:  Oriented to Self, Oriented to Place, Oriented to  Time, Oriented to Situation Alcohol / Substance use:  Never Used, Other (Patient reports that she has never smoked and does not drink or use illicit drugs.) Psych involvement (Current and /or in the community):  No (Comment)  Discharge Needs  Concerns to be addressed:  Home Safety Concerns (Patient very weak and lives alone) Readmission within the last 30 days:  No Current discharge risk:  Lives alone Barriers to Discharge:  No Barriers Identified   Sable Feil, LCSW 10/11/2014, 4:29 PM

## 2014-10-11 NOTE — Progress Notes (Signed)
STROKE TEAM PROGRESS NOTE   HISTORY Diana Ellis is an 81 y.o. female with history of breast cancer with mets to hips an vertebrae -- currently on Ibrance, HTN, MVP, MR, TR, PVCs and HDL. Per son who is in the room, he had spoken to her at 1000 (LKW 10/08/2014 at 1000) and noted she was speaking slow but no slurred words but at 12:45 her other son went over to her house and noted not only slurred words but left leg weakness. They brought her to the hospital. 5 hours later her symptoms cleared. Currently she is having no symptoms. Patient was on ASA in past but was taken off this due to increased risk of bleeding while on Ibrance. Since in hospital she has been placed on ASA 81 mg daily. PLT at current time is 104. Modified Rankin: Rankin Score=0. Patient was not administered TPA secondary to delay in arrival. She was admitted for further evaluation and treatment.   SUBJECTIVE (INTERVAL HISTORY) Her endo RN is at the bedside.  Overall she feels her condition is stable. She is getting a TEE.   OBJECTIVE Temp:  [97.7 F (36.5 C)-98.1 F (36.7 C)] 98 F (36.7 C) (05/04 1250) Pulse Rate:  [38-104] 41 (05/04 1305) Cardiac Rhythm:  [-] Normal sinus rhythm (05/03 2150) Resp:  [13-21] 19 (05/04 1305) BP: (115-167)/(42-90) 133/42 mmHg (05/04 1305) SpO2:  [96 %-100 %] 97 % (05/04 1305) Weight:  [109 lb 2.8 oz (49.52 kg)] 109 lb 2.8 oz (49.52 kg) (05/03 2124)   Recent Labs Lab 10/08/14 1512  GLUCAP 111*    Recent Labs Lab 10/08/14 1442 10/08/14 2317 10/09/14 0502 10/11/14 0531  NA 136  --  134* 134*  K 4.2  --  4.4 4.0  CL 101  --  103 105  CO2 26  --  22 21*  GLUCOSE 126*  --  151* 126*  BUN 17  --  15 20  CREATININE 1.08* 1.10* 0.87 0.79  CALCIUM 8.7*  --  7.9* 7.5*  MG  --   --  2.1  --     Recent Labs Lab 10/08/14 1442 10/09/14 0502  AST 26 23  ALT 11* 11*  ALKPHOS 65 69  BILITOT 0.3 0.5  PROT 6.7 6.2*  ALBUMIN 3.2* 2.8*    Recent Labs Lab 10/08/14 1442  10/08/14 2317 10/09/14 0502 10/11/14 0531  WBC 3.6 1.8* 2.9* 3.2*  NEUTROABS 1.5  --  2.2  --   HGB 9.5* 8.9* 8.8* 7.4*  HCT 28.1* 26.5* 26.3* 22.0*  MCV 95.1 91.7 91.6 93.2  PLT 108* 100* 104* 90*    Recent Labs Lab 10/09/14 0502  CKTOTAL 53  CKMB 3.9   No results for input(s): LABPROT, INR in the last 72 hours. No results for input(s): COLORURINE, LABSPEC, [place], GLUCOSEU, HGBUR, BILIRUBINUR, KETONESUR, PROTEINUR, UROBILINOGEN, NITRITE, LEUKOCYTESUR in the last 72 hours.  Invalid input(s): APPERANCEUR     Component Value Date/Time   CHOL 221* 10/09/2014 0502   TRIG 111 10/09/2014 0502   HDL 57 10/09/2014 0502   CHOLHDL 3.9 10/09/2014 0502   VLDL 22 10/09/2014 0502   LDLCALC 142* 10/09/2014 0502   Lab Results  Component Value Date   HGBA1C 5.7* 10/09/2014   No results found for: LABOPIA, COCAINSCRNUR, LABBENZ, AMPHETMU, THCU, LABBARB  No results for input(s): ETH in the last 168 hours.  No results found. Carotid Doppler  There is 1-39% bilateral ICA stenosis. Vertebral artery flow is antegrade.    2D Echocardiogram   -  Left ventricle: The cavity size was normal. Wall thickness was increased in a pattern of mild LVH. Systolic function wasvigorous. The estimated ejection fraction was in the range of 65%to 70%. Wall motion was normal; there were no regional wallmotion abnormalities. Doppler parameters are consistent withabnormal left ventricular relaxation (grade 1 diastolicdysfunction). - Mitral valve: There was mild regurgitation. - Atrial septum: No defect or patent foramen ovale was identified. - Pulmonary arteries: PA peak pressure: 35 mm Hg (S). Impressions: No cardiac source of emboli was indentified.   PHYSICAL EXAM Frail elderly lady not in distress. . Afebrile. Head is nontraumatic. Neck is supple without bruit.    Cardiac exam no murmur or gallop. Lungs are clear to auscultation. Distal pulses are well felt. Neurological Exam ; Awake alert oriented x  3 normal speech and language. Mild left lower face asymmetry. Tongue midline. No drift. Mild diminished fine finger movements on left. Orbits right over left upper extremity. Mild left grip and hip flexor weak.. Normal sensation . Normal coordination.gait deferred  ASSESSMENT/PLAN Diana Ellis is a 81 y.o. female with history of breast cancer with mets to hips an vertebrae -- currently on Ibrance, HTN, MVP, MR, TR, PVCs and HDL presenting with slurred speech and left leg weakness. She did not receive IV t-PA due to delay in arrival.   Stroke:  bilateral frontal, parietal and cerebellar infarcts, appear embolic secondary to unknown source. Likely cardiogenic source vs hypercoagulable from breast cancer  Resultant  Left hemiparesis, expressive aphasia  MRI   2 x 2.4 cm LEFT inferior frontal lobe mass with imaging characteristics of meningioma, less likely dural metastasis with surrounding vasogenic edema.  Multiple acute bifrontal, biparietal and bilateral cerebellar subcentimeter infarcts likely representing embolic phenomena  MRA  Not done  Carotid Doppler  No significant stenosis   2D Echo  No source of embolus  TEE to look for embolic source. Arranged with [facility] for tomorrow.  If positive for PFO (patent foramen ovale), check bilateral lower extremity venous dopplers to rule out DVT as possible source of stroke. (I have made patient NPO after midnight tonight).  Will not consider for loop as not a good long-term anticoagulation candidate  HgbA1c 5.7  Lovenox 40 mg sq daily for VTE prophylaxis Diet Heart Room service appropriate?: Yes; Fluid consistency:: Thin  no antithrombotic prior to admission, now on aspirin 81 mg orally every day.   Therapy recommendations:  SNF  Disposition:  pending   Breast Cancer with mets to hips and vertebrae  Hx breast cancer 2011 with high risk of recurrence  No brain mets seen on  MRI  Meningioma  Asymptomatic  Incidental finding  No intervention indicated at this time  Hypertension  Unstable  Hyperlipidemia  Home meds:  No statin   LDL 142, goal < 70  Recommend Addition of statin   Other Stroke Risk Factors  Advanced age  Coronary artery disease  Other Active Problems  Pancytopenia  Recent EGD w/ esophageal dilatation  Hospital day # [place] for Pager information 10/11/2014 3:12 PM  I have personally examined this patient, reviewed notes, independently viewed imaging studies, participated in medical decision making and plan of care. I have made any additions or clarifications directly to the above note. Agree with note above.  . Continue ongoing stroke evaluation. Stroke team will follow Antony Contras, MD Medical Director [place] Pager: [phone] 10/11/2014 3:12 PM  To contact Stroke Continuity provider, please refer to [email or web address] After hours, contact General Neurology

## 2014-10-11 NOTE — Progress Notes (Signed)
PT Cancellation Note  Patient Details Name: Diana Ellis MRN: [id number] DOB: 17-Feb-1934   Cancelled Treatment:    Reason Eval/Treat Not Completed: Patient at procedure or test/unavailable   At TEE;  Will follow up later today as time allows;  Otherwise, will follow up for PT tomorrow;   Thank you,  Roney Marion, PT  Acute Rehabilitation Services Pager [phone] Office [phone]     Roney Marion [facility] 10/11/2014, 12:05 PM

## 2014-10-11 NOTE — Progress Notes (Signed)
Tee done

## 2014-10-11 NOTE — CV Procedure (Signed)
    Transesophageal Echocardiogram Note  Diana Ellis [id number] 09/10/1933  Procedure: Transesophageal Echocardiogram Indications: CVA   Procedure Details Consent: Obtained Time Out: Verified patient identification, verified procedure, site/side was marked, verified correct patient position, special equipment/implants available, Radiology Safety Procedures followed,  medications/allergies/relevent history reviewed, required imaging and test results available.  Performed  Medications: Fentanyl: 37.5 mcg  Versed: 2 mg iv   Left Ventrical:  Normal LV function.  Mitral Valve: moderate MR.   Aortic Valve: normal AV   Tricuspid Valve: mild TR   Pulmonic Valve: normal    Left Atrium/ Left atrial appendage: no thrombi  Atrial septum:  There was a small right to left shunt that appeared late - possible pulmonary AVM vs. Small PFO.   Aorta: mild calcification   Complications: No apparent complications Patient did tolerate procedure well.   Thayer Headings, Brooke Bonito., MD, [facility] 10/11/2014, 12:31 PM

## 2014-10-11 NOTE — Evaluation (Signed)
Occupational Therapy Evaluation Patient Details Name: Diana Ellis MRN: [id number] DOB: [birth year] Today's Date: 10/11/2014    History of Present Illness Diana Ellis is an 81 y.o. female with history of breast cancer with mets to hips an vertebrae -- currently on Ibrance, HTN, MVP, MR, TR, PVCs and HDL. Per son who is in the room, he had spoken to her at 27 and noted she was speaking slow but no slurred words but at 12:45 her other son went over to her house and noted not only slurred words but left leg weakness. They brought her to the hospital. 5 hours later her symptoms cleared. Imaging revealed Multiple acute bifrontal, biparietal and bilateral cerebellar as well as meningioma   Clinical Impression   Pt was weak at home x 1 month prior to admission, but able to care for herself.  She presents with generalized weakness, L side >R side, impaired balance and difficulty with expressive communication.  She is able to self feed with set up, but eats more when assisted.  She requires assist with all ADL. Plan is for discharge to [facility] in SNF tomorrow.  Will defer further OT to SNF.    Follow Up Recommendations  SNF;Supervision/Assistance - 24 hour    Equipment Recommendations       Recommendations for Other Services       Precautions / Restrictions Precautions Precautions: Fall Restrictions Weight Bearing Restrictions: No      Mobility Bed Mobility Overal bed mobility: Needs Assistance Bed Mobility: Supine to Sit;Sit to Supine     Supine to sit: Min  Sit to supine: Min    General bed mobility comments: extra time, use of rail, HOB up  Transfers                      Balance     Sitting balance-Leahy Scale: Fair                                      ADL Overall ADL's : Needs assistance/impaired Eating/Feeding: Minimal assistance;Bed level Eating/Feeding Details (indicate cue type and reason): pt can self feed, but when assisted she consumes  more Grooming: Wash/dry face;Minimal assistance;Wash/dry hands;Bed level   Upper Body Bathing: Sitting;Moderate assistance   Lower Body Bathing: Sitting/lateral leans;Maximal assistance   Upper Body Dressing : Moderate assistance;Sitting   Lower Body Dressing: Maximal assistance;Sit to/from stand                       Vision Vision Assessment?: Vision impaired- to be further tested in functional context Additional Comments: Pt with intact peripheral fields.  Not able to conform to formal ocular testing due to distractibility.   Perception     Praxis      Pertinent Vitals/Pain Pain Assessment: No/denies pain     Hand Dominance Right   Extremity/Trunk Assessment Upper Extremity Assessment Upper Extremity Assessment: LUE deficits/detail LUE Deficits / Details: +drift, 3+/5 shoulder, 4/5 elbow to hand LUE Coordination: decreased fine motor;decreased gross motor   Lower Extremity Assessment Lower Extremity Assessment: Defer to PT evaluation   Cervical / Trunk Assessment Cervical / Trunk Assessment: Normal   Communication Communication Communication: Expressive difficulties   Cognition Arousal/Alertness: Awake/alert Behavior During Therapy: Flat affect Overall Cognitive Status: Impaired/Different from baseline Area of Impairment: Memory;Attention   Current Attention Level: Alternating Memory: Decreased short-term memory  General Comments       Exercises       Shoulder Instructions      Home Living Family/patient expects to be discharged to:: [place]: None          Prior Functioning/Environment Level of Independence: Independent        Comments: Son reports only very recent memory issues. Pt was weak in the month prior to admission, but able to care for herself and perform light housekeeping and meal prep.    OT Diagnosis: Generalized weakness;Cognitive  deficits;Hemiplegia non-dominant side   OT Problem List:     OT Treatment/Interventions:      OT Goals(Current goals can be found in the care plan section) Acute Rehab OT Goals Patient Stated Goal: SNF tomorrow  OT Frequency:     Barriers to D/C:            Co-evaluation              End of Session    Activity Tolerance: Patient tolerated treatment well Patient left: in bed;with call bell/phone within reach;with family/visitor present   Time: 1540-1556 OT Time Calculation (min): 16 min Charges:  OT General Charges $OT Visit: 1 Procedure OT Evaluation $Initial OT Evaluation Tier I: 1 Procedure G-Codes:    Malka So 10/11/2014, 4:19 PM [phone]

## 2014-10-11 NOTE — Interval H&P Note (Signed)
History and Physical Interval Note:  10/11/2014 12:09 PM  Diana Ellis  has presented today for surgery, with the diagnosis of STROKE  The various methods of treatment have been discussed with the patient and family. After consideration of risks, benefits and other options for treatment, the patient has consented to  Procedure(s): TRANSESOPHAGEAL ECHOCARDIOGRAM (TEE) (N/A) as a surgical intervention .  The patient's history has been reviewed, patient examined, no change in status, stable for surgery.  I have reviewed the patient's chart and labs.  Questions were answered to the patient's satisfaction.     [place], Wonda Cheng

## 2014-10-11 NOTE — Progress Notes (Signed)
OT Cancellation Note  Patient Details Name: Diana Ellis MRN: [id number] DOB: 1934/02/17   Cancelled Treatment:    Reason Eval/Treat Not Completed: Patient at procedure or test/ unavailable. Will continue to follow.  Malka So 10/11/2014, 11:10 AM

## 2014-10-11 NOTE — Progress Notes (Signed)
SLP Cancellation Note  Patient Details Name: Diana Ellis MRN: [id number] DOB: 1933-06-17   Cancelled evaluation:     Pt passed RN stroke swallow screen; she has been tolerating diet well according to son and RN.  Therefore, no formal SLP swallow warranted per protocol.  Pt for potential D/C soon to SNF - will benefit from speech/communication eval in that venue given bilateral CVAs and changes in communication per notes, son report.        Juan Quam Laurice 10/11/2014, 3:26 PM

## 2014-10-11 NOTE — H&P (View-Only) (Signed)
[facility] PROGRESS NOTE  Diana Ellis HYQ:[id number] DOB: 1934-01-15 DOA: 10/08/2014 PCP: [name], MD  Assessment/Plan: 1-Acute Stroke: multiple bifrontal, bilateral cerebellar infarcts.  -Embolic Neurology following Monitor on telemetry, Pt and Family report history of irregular rhythm. Requested records from primary cardiologist [facility] clinic Suspect that she will be a poor anticoagulation candidate due to 2cm meningioma with vasogenic edema, now on ASA 81mg  ECHO no source of embolism. , Doppler 1 to 39 % stenosis. PT, Pt following, SNF recommended LDL elevated, started statins.  TEE today  2-Brain Mass, Meningioma, less likely dural metastasis.  Metastatic breast cancer Dr.Regelado discussed with [name]. He will arrange close follow up with patient. Ok to discharge patient on decadron 8 mg daily.  Ok to resume Ibrance at discharge.   3-HTN; held diuretic in setting acute stroke.   4-Pancytopenia; due to chemo/Ibrance -Hb 7.4 this am, repeat tomorrow -transfuse if Hb <7  Code Status: Full Code.  Family Communication: Care discussed with family at bedside.  Disposition Plan: remain inpatient. Need SNF, TEE on 5-4   Consultants:  Neurology  Procedures:  ECHO; no source of stroke.   Doppler no significant stenosis.   Antibiotics:  none  HPI/Subjective: Speech unchanged  Objective: Filed Vitals:   10/11/14 0405  BP: 148/67  Pulse: 104  Temp: 97.8 F (36.6 C)  Resp: 16    Intake/Output Summary (Last 24 hours) at 10/11/14 0802 Last data filed at 10/11/14 0600  Gross per 24 hour  Intake   1010 ml  Output    900 ml  Net    110 ml   Filed Weights   10/08/14 2256 10/09/14 2115 10/10/14 2124  Weight: 47.25 kg (104 lb 2.7 oz) 48.72 kg (107 lb 6.5 oz) 49.52 kg (109 lb 2.8 oz)    Exam:   General:  NAD, AAOx3, weak appearing  Cardiovascular: S 1, S 2   Respiratory: CTA  Abdomen: BS present, soft, nt  Musculoskeletal: no  edema.   Neuro: non focal  Data Reviewed: Basic Metabolic Panel:  Recent Labs Lab 10/08/14 1442 10/08/14 2317 10/09/14 0502 10/11/14 0531  NA 136  --  134* 134*  K 4.2  --  4.4 4.0  CL 101  --  103 105  CO2 26  --  22 21*  GLUCOSE 126*  --  151* 126*  BUN 17  --  15 20  CREATININE 1.08* 1.10* 0.87 0.79  CALCIUM 8.7*  --  7.9* 7.5*  MG  --   --  2.1  --    Liver Function Tests:  Recent Labs Lab 10/08/14 1442 10/09/14 0502  AST 26 23  ALT 11* 11*  ALKPHOS 65 69  BILITOT 0.3 0.5  PROT 6.7 6.2*  ALBUMIN 3.2* 2.8*   No results for input(s): LIPASE, AMYLASE in the last 168 hours. No results for input(s): AMMONIA in the last 168 hours. CBC:  Recent Labs Lab 10/08/14 1442 10/08/14 2317 10/09/14 0502 10/11/14 0531  WBC 3.6 1.8* 2.9* 3.2*  NEUTROABS 1.5  --  2.2  --   HGB 9.5* 8.9* 8.8* 7.4*  HCT 28.1* 26.5* 26.3* 22.0*  MCV 95.1 91.7 91.6 93.2  PLT 108* 100* 104* 90*   Cardiac Enzymes:  Recent Labs Lab 10/09/14 0502  CKTOTAL 53  CKMB 3.9   BNP (last 3 results) No results for input(s): BNP in the last 8760 hours.  ProBNP (last 3 results) No results for input(s): PROBNP in the last 8760 hours.  CBG:  Recent Labs Lab 10/08/14 1512  GLUCAP 111*    No results found for this or any previous visit (from the past 240 hour(s)).   Studies: No results found.  Scheduled Meds: . aspirin EC  81 mg Oral Daily  . calcium carbonate  1 tablet Oral Q breakfast  . cholecalciferol  1,000 Units Oral Daily  . dexamethasone  4 mg Oral Q12H  . enoxaparin (LOVENOX) injection  40 mg Subcutaneous Q24H  . feeding supplement (ENSURE ENLIVE)  237 mL Oral BID BM  . gabapentin  300 mg Oral TID  . pantoprazole  40 mg Oral BID  . simvastatin  10 mg Oral q1800   Continuous Infusions: . sodium chloride 50 mL/hr (10/10/14 1436)    Active Problems:   Breast cancer   Dysphagia, pharyngoesophageal phase   Left hemiparesis   Slurred speech   Bone metastases   Brain  metastases   HTN (hypertension)   Mitral valve prolapse   Mitral regurgitation   HLD (hyperlipidemia)   Laryngeal stenosis   Diverticulosis of colon without hemorrhage   Metastatic cancer to brain   Resting tremor   Cerebral embolism with cerebral infarction    Time spent: 35 minutes.     [facility]  [facility] Pager [phone]. If 7PM-7AM, please contact night-coverage at [email or web address], password [facility] 10/11/2014, 8:02 AM  LOS: 3 days

## 2014-10-12 ENCOUNTER — Inpatient Hospital Stay (HOSPITAL_COMMUNITY): Payer: Medicare Other

## 2014-10-12 ENCOUNTER — Other Ambulatory Visit: Payer: Self-pay | Admitting: *Deleted

## 2014-10-12 ENCOUNTER — Encounter (HOSPITAL_COMMUNITY): Payer: Self-pay | Admitting: Cardiovascular Disease

## 2014-10-12 ENCOUNTER — Ambulatory Visit: Payer: Self-pay | Admitting: Oncology

## 2014-10-12 DIAGNOSIS — C50919 Malignant neoplasm of unspecified site of unspecified female breast: Secondary | ICD-10-CM

## 2014-10-12 DIAGNOSIS — Q211 Atrial septal defect: Secondary | ICD-10-CM

## 2014-10-12 DIAGNOSIS — R1314 Dysphagia, pharyngoesophageal phase: Secondary | ICD-10-CM

## 2014-10-12 LAB — BASIC METABOLIC PANEL
Anion gap: 10 (ref 5–15)
BUN: 15 mg/dL (ref 6–20)
CALCIUM: 7.8 mg/dL — AB (ref 8.9–10.3)
CO2: 21 mmol/L — ABNORMAL LOW (ref 22–32)
Chloride: 102 mmol/L (ref 101–111)
Creatinine, Ser: 0.77 mg/dL (ref 0.44–1.00)
Glucose, Bld: 141 mg/dL — ABNORMAL HIGH (ref 70–99)
POTASSIUM: 3.8 mmol/L (ref 3.5–5.1)
Sodium: 133 mmol/L — ABNORMAL LOW (ref 135–145)

## 2014-10-12 LAB — CBC
HEMATOCRIT: 26 % — AB (ref 36.0–46.0)
HEMOGLOBIN: 8.9 g/dL — AB (ref 12.0–15.0)
MCH: 31.3 pg (ref 26.0–34.0)
MCHC: 34.2 g/dL (ref 30.0–36.0)
MCV: 91.5 fL (ref 78.0–100.0)
Platelets: 99 10*3/uL — ABNORMAL LOW (ref 150–400)
RBC: 2.84 MIL/uL — AB (ref 3.87–5.11)
RDW: 22.5 % — ABNORMAL HIGH (ref 11.5–15.5)
WBC: 2.8 10*3/uL — ABNORMAL LOW (ref 4.0–10.5)

## 2014-10-12 MED ORDER — PALBOCICLIB 125 MG PO CAPS: 125.0000 mg | ORAL_CAPSULE | Freq: Every day | ORAL | Status: DC

## 2014-10-12 MED ORDER — DEXAMETHASONE 4 MG PO TABS: 4.0000 mg | ORAL_TABLET | Freq: Two times a day (BID) | ORAL | Status: AC

## 2014-10-12 MED ORDER — ASPIRIN 81 MG PO TBEC: 81.0000 mg | DELAYED_RELEASE_TABLET | Freq: Every day | ORAL | Status: AC

## 2014-10-12 NOTE — Clinical Social Work Placement (Signed)
   CLINICAL SOCIAL WORK PLACEMENT  NOTE  Date:  10/12/2014  Patient Details  Name: Diana Ellis MRN: [id number] Date of Birth: Nov 01, 1933  Clinical Social Work is seeking post-discharge placement for this patient at the [place] level of care (*CSW will initial, date and re-position this form in  chart as items are completed):  Yes   Patient/family provided with [place] Work Department's list of facilities offering this level of care within the geographic area requested by the patient (or if unable, by the patient's family).  Yes   Patient/family informed of their freedom to choose among providers that offer the needed level of care, that participate in Medicare, Medicaid or managed care program needed by the patient, have an available bed and are willing to accept the patient.  Yes   Patient/family informed of [facility]'s ownership interest in [facility] and [facility], as well as of the fact that they are under no obligation to receive care at these facilities.  PASRR submitted to EDS on 10/10/14     PASRR number received on 10/10/14     Existing PASRR number confirmed on  10/10/14     FL2 transmitted to all facilities in geographic area requested by pt/family on 10/10/14     FL2 transmitted to all facilities within larger geographic area on       Patient informed that his/her managed care company has contracts with or will negotiate with certain facilities, including the following:        Yes   Patient/family informed of bed offers received.  Patient chooses bed at [facility]     Physician recommends and patient chooses bed at      Patient to be transferred to [facility] on  .  Patient to be transferred to facility by  ambulance     Patient family notified on  10/12/14 of transfer.  Name of family member notified:   Daughter-in-law [name]     PHYSICIAN       Additional Comment:     _______________________________________________ Sable Feil, LCSW 10/12/2014, 2:12 PM

## 2014-10-12 NOTE — Discharge Summary (Signed)
Physician Discharge Summary  Diana Ellis YBO:[id number] DOB: Jul 25, 1933 DOA: 10/08/2014  PCP: [name], MD  Admit date: 10/08/2014 Discharge date: 10/12/2014  Time spent: 45 minutes  Recommendations for Outpatient Follow-up:  1. Dr.Choksi in 2 weeks   Discharge Diagnoses:    Embolic CVA   Metastatic Breast cancer   Dysphagia, pharyngoesophageal phase   Left hemiparesis   Slurred speech   Bone metastases   Frontal lobe mass 2 x 2.4cm, with imaging characteristics of meningioma    HTN (hypertension)   Mitral valve prolapse   Mitral regurgitation   HLD (hyperlipidemia)   Laryngeal stenosis   Diverticulosis of colon without hemorrhage   Metastatic cancer to brain   Resting tremor   Cerebral embolism with cerebral infarction   Discharge Condition: stable but overall prognosis guarded  Diet recommendation: heart healthy  Filed Weights   10/09/14 2115 10/10/14 2124 10/12/14 0435  Weight: 48.72 kg (107 lb 6.5 oz) 49.52 kg (109 lb 2.8 oz) 50.939 kg (112 lb 4.8 oz)    History of present illness:  Chief Complaint: Slurred speech/left sided weakness 80/M with metastatic breast CA on Ibrance admitted from [facility] ER with slurred speech and L sided hemiparesis  Hospital Course: 1-Acute Stroke: multiple bifrontal, bilateral cerebellar infarcts.  -Resultant Left hemiparesis, expressive aphasia -suspected to be Embolic in etiology, cardio- embolic vs hypercoagulable state from Breast CA -seen by Neurology Dr.Sethi, now started on ASA 81mg  -MRI 2 x 2.4 cm LEFT inferior frontal lobe mass with imaging characteristics of meningioma, less likely dural metastasis with surrounding vasogenic edema. Multiple acute bifrontal, biparietal and bilateral cerebellar subcentimeter infarcts likely representing embolic phenomena -Carotid Doppler No significant stenosis -2D Echo No source of embolus -TEE negative for embolus, possible small PFO, and lower ext venous duplex negative for  DVT -per Neuro: Will not consider for loop as not a good long-term anticoagulation candidate -HgbA1c 5.7, LDL elevated, started statins.  -seen by PT, SNF recommended  2-Brain Mass, Meningioma, less likely dural metastasis.  Metastatic breast cancer currently under treatment by Dr.Choksi, now on Ibrance; currently held prior to admission due to cost concerns Dr.Regalado discussed with Dr Forest Gleason. He will arrange close follow up with patient. Ok to discharge patient on decadron 8 mg daily.  She needs quick Fu with Dr.Choksi for further management of her malignancy   3-HTN; held diuretic in setting acute stroke. -BP table  4-Pancytopenia; due to chemo/Ibrance -stable   Consultations:  Neurology  Discharge Exam: Filed Vitals:   10/12/14 1300  BP: 150/66  Pulse: 90  Temp: 98.2 F (36.8 C)  Resp: 18    General: AAOx3, facial droop Cardiovascular:S1S2/RRR Respiratory: CTAB  Discharge Instructions    Current Discharge Medication List    START taking these medications   Details  aspirin EC 81 MG EC tablet Take 1 tablet (81 mg total) by mouth daily.    dexamethasone (DECADRON) 4 MG tablet Take 1 tablet (4 mg total) by mouth every 12 (twelve) hours. Qty: 30 tablet, Refills: 0      CONTINUE these medications which have CHANGED   Details  palbociclib (IBRANCE) 125 MG capsule Take 1 capsule (125 mg total) by mouth daily with lunch. Based on discussion with Dr.Choski upon follow up      CONTINUE these medications which have NOT CHANGED   Details  acetaminophen (TYLENOL ARTHRITIS PAIN) 650 MG CR tablet Take 1,300 mg by mouth at bedtime.     calcium carbonate (OS-CAL) 600 MG TABS tablet Take 600  mg by mouth daily with breakfast.    cholecalciferol (VITAMIN D) 1000 UNITS tablet Take 1,000 Units by mouth daily.    Fulvestrant (FASLODEX IM) Inject 1 Dose into the muscle every 28 (twenty-eight) days.    gabapentin (NEURONTIN) 300 MG capsule Take 300 mg by mouth 3  (three) times daily.    Glucosamine-Chondroit-Vit C-Mn (GLUCOSAMINE 1500 COMPLEX) CAPS Take by mouth daily.    Krill Oil 300 MG CAPS Take 300 mg by mouth daily.    MAGNESIUM ASPARTATE PO Take 1 tablet by mouth daily.    omeprazole (PRILOSEC) 20 MG capsule Take 20 mg by mouth 2 (two) times daily before a meal.    PRESCRIPTION MEDICATION Inject 1 Dose into the muscle every 14 (fourteen) days.      STOP taking these medications     triamterene-hydrochlorothiazide (MAXZIDE-25) 37.5-25 MG per tablet        Allergies  Allergen Reactions  . Codeine Nausea Only   Follow-up Information    Follow up with Forest Gleason, MD. Schedule an appointment as soon as possible for a visit in 1 week.   Specialty:  Oncology   Contact information:   [address] [place] [address] [phone]        The results of significant diagnostics from this hospitalization (including imaging, microbiology, ancillary and laboratory) are listed below for reference.    Significant Diagnostic Studies: Ct Head Wo Contrast  10/08/2014   CLINICAL DATA:  Code stroke, slurred speech, history of metastatic breast cancer  EXAM: CT HEAD WITHOUT CONTRAST  TECHNIQUE: Contiguous axial images were obtained from the base of the skull through the vertex without intravenous contrast.  COMPARISON:  None.  FINDINGS: Hypodensity in the subcortical left frontal lobe (series 2/image 10), measuring 3.5 x 3.8 cm. While ischemic stroke is possible, the appearance is worrisome for vasogenic edema related to metastasis.  No evidence of parenchymal hemorrhage or extra-axial fluid collection.  No midline shift.  No CT evidence of acute infarction.  Subcortical white matter and periventricular small vessel ischemic changes.  Cerebral volume is within normal limits.  No ventriculomegaly.  The visualized paranasal sinuses are essentially clear. The mastoid air cells are unopacified.  No fracture or dislocation is seen.   IMPRESSION: Subcortical hypodensity in the left frontal lobe, measuring 3.5 x 3.8 cm, worrisome for metastasis, less likely acute ischemic stroke.  Consider MRI brain with contrast for further evaluation as clinically warranted.  These results were called by telephone at the time of interpretation on 10/08/2014 at 3:31 pm to Dr. [name] , who verbally acknowledged these results.   Electronically Signed   By: Julian Hy M.D.   On: 10/08/2014 15:34   Ct Chest W Contrast  10/09/2014   CLINICAL DATA:  Breast cancer.  Weakness.  Skeletal metastasis.  EXAM: CT CHEST, ABDOMEN, AND PELVIS WITH CONTRAST  TECHNIQUE: Multidetector CT imaging of the chest, abdomen and pelvis was performed following the standard protocol during bolus administration of intravenous contrast.  CONTRAST:  19mL OMNIPAQUE IOHEXOL 300 MG/ML  SOLN  COMPARISON:  None.  FINDINGS: CT CHEST FINDINGS  Mediastinum/Nodes: No axillary or supraclavicular lymphadenopathy. No mediastinal hilar lymphadenopathy. No central pulmonary embolism. No pericardial fluid. Esophagus normal.  Lungs/Pleura: Subpleural nodule in the right middle lobe measures 2 mm (image 37, series 3.  CT ABDOMEN AND PELVIS FINDINGS  Hepatobiliary: Hyper enhancing lesion in the subcapsular right hepatic lobe measures 15 mm on image 56, series 2. A second lesion in  the right hepatic lobe measures 10 mm on image 54. There are 2 hypervascular lesions in the left hepatic lobe on image 56 and 51. These lesions persists on the delayed imaging suggesting hemangioma. No biliary duct dilatation. Gallbladder is normal.  Pancreas: Pancreas is normal. No ductal dilatation. No pancreatic inflammation.  Spleen: Normal spleen  Adrenals/urinary tract: Adrenal glands and kidneys are normal. The ureters and bladder normal.  Stomach/Bowel: Stomach, small bowel, appendix, and cecum are normal. The colon and rectosigmoid colon are normal. There are diverticula sigmoid colon without acute inflammation.   Vascular/Lymphatic: Abdominal aorta is normal caliber. There is no retroperitoneal or periportal lymphadenopathy. No pelvic lymphadenopathy.  Reproductive: Post hysterectomy anatomy.  No adnexal abnormality.  Musculoskeletal: There multiple sclerotic lesions throughout the pelvis sacrum. Multiple sclerotic lesions throughout the entirety of the cervical, thoracic and lumbar spine. No pathologic fracture addendum  Other: No free fluid.  IMPRESSION: Chest :  1. No evidence of pulmonary metastasis or adenopathy. 2. Small subpleural nodule in the right middle lobe is likely benign.  Abdomen / Pelvis Impression:  1. No evidence of soft tissue metastasis within the abdomen pelvis. 2. Hypervascular lesions within the liver are likely benign flash filling hemangiomas. Further characterization could be achieved with abdominal MRI with without contrast. 3. Widespread sclerotic skeletal metastasis within the spine and pelvis.   Electronically Signed   By: Suzy Bouchard M.D.   On: 10/09/2014 01:33   Mr Jeri Cos AT Contrast  10/08/2014   CLINICAL DATA:  Speech disturbance noted early this afternoon, now resolved. LEFT-sided weakness, worse than generalized weakness for the last 2 months. History of breast cancer, diffuse metastasis.  EXAM: MRI HEAD WITHOUT CONTRAST  TECHNIQUE: Multiplanar, multiecho pulse sequences of the brain and surrounding structures were obtained without intravenous contrast.  COMPARISON:  CT of the head Oct 08, 2014 at 1519 hours and head CT August 16, 2014  FINDINGS: Low T2, intermediate to low T1 homogeneously enhancing 2 x 2.4 cm mass LEFT inferior frontal lobe, with slight apparent dural tail. Surrounding T2 bright vasogenic edema and local sulcal effacement, without midline shift. No additional suspicious intracranial enhancement.  Multiple subcentimeter foci of reduced diffusion bilateral inferior cerebellum, with corresponding low ADC values. Similar subcentimeter foci and posterior frontal lobes  and parietal lobes bilaterally. Punctate focus of susceptibility artifact in LEFT frontal lobe. The ventricles and sulci are normal for patient's age. Scattered subcentimeter supratentorial white matter FLAIR T2 hyperintensities exclusive of the aforementioned foci of acute ischemia. Tiny bilateral basal ganglia perivascular spaces, unlikely to reflect the lacunar infarcts.  No abnormal extra-axial fluid collections. Normal major intracranial vascular flow voids seen at the skull base. Scattered small mucosal retention cyst without paranasal sinus air-fluid levels. Minimal RIGHT mastoid effusion. Ocular globes and orbital contents appear normal though not tailored for evaluation. No abnormal sellar expansion. No cerebellar tonsillar ectopia. Abnormal low T1 signal in the LEFT frontal calvarium without enhancement, unlikely to reflect metastasis.  IMPRESSION: 2 x 2.4 cm LEFT inferior frontal lobe mass with imaging characteristics of meningioma, less likely dural metastasis with surrounding vasogenic edema.  Multiple acute bifrontal, biparietal and bilateral cerebellar subcentimeter infarcts likely representing embolic phenomena.   Electronically Signed   By: Elon Alas   On: 10/08/2014 22:36   Ct Abdomen Pelvis W Contrast  10/09/2014   CLINICAL DATA:  Breast cancer.  Weakness.  Skeletal metastasis.  EXAM: CT CHEST, ABDOMEN, AND PELVIS WITH CONTRAST  TECHNIQUE: Multidetector CT imaging of the chest, abdomen and pelvis  was performed following the standard protocol during bolus administration of intravenous contrast.  CONTRAST:  51mL OMNIPAQUE IOHEXOL 300 MG/ML  SOLN  COMPARISON:  None.  FINDINGS: CT CHEST FINDINGS  Mediastinum/Nodes: No axillary or supraclavicular lymphadenopathy. No mediastinal hilar lymphadenopathy. No central pulmonary embolism. No pericardial fluid. Esophagus normal.  Lungs/Pleura: Subpleural nodule in the right middle lobe measures 2 mm (image 37, series 3.  CT ABDOMEN AND PELVIS FINDINGS   Hepatobiliary: Hyper enhancing lesion in the subcapsular right hepatic lobe measures 15 mm on image 56, series 2. A second lesion in the right hepatic lobe measures 10 mm on image 54. There are 2 hypervascular lesions in the left hepatic lobe on image 56 and 51. These lesions persists on the delayed imaging suggesting hemangioma. No biliary duct dilatation. Gallbladder is normal.  Pancreas: Pancreas is normal. No ductal dilatation. No pancreatic inflammation.  Spleen: Normal spleen  Adrenals/urinary tract: Adrenal glands and kidneys are normal. The ureters and bladder normal.  Stomach/Bowel: Stomach, small bowel, appendix, and cecum are normal. The colon and rectosigmoid colon are normal. There are diverticula sigmoid colon without acute inflammation.  Vascular/Lymphatic: Abdominal aorta is normal caliber. There is no retroperitoneal or periportal lymphadenopathy. No pelvic lymphadenopathy.  Reproductive: Post hysterectomy anatomy.  No adnexal abnormality.  Musculoskeletal: There multiple sclerotic lesions throughout the pelvis sacrum. Multiple sclerotic lesions throughout the entirety of the cervical, thoracic and lumbar spine. No pathologic fracture addendum  Other: No free fluid.  IMPRESSION: Chest :  1. No evidence of pulmonary metastasis or adenopathy. 2. Small subpleural nodule in the right middle lobe is likely benign.  Abdomen / Pelvis Impression:  1. No evidence of soft tissue metastasis within the abdomen pelvis. 2. Hypervascular lesions within the liver are likely benign flash filling hemangiomas. Further characterization could be achieved with abdominal MRI with without contrast. 3. Widespread sclerotic skeletal metastasis within the spine and pelvis.   Electronically Signed   By: Suzy Bouchard M.D.   On: 10/09/2014 01:33   Dg Chest Portable 1 View  10/08/2014   CLINICAL DATA:  Confusion today. History of metastatic breast cancer. Initial encounter.  EXAM: PORTABLE CHEST - 1 VIEW  COMPARISON:   Radiographs 08/02/2009. Chest CT 08/03/2014 and PET-CT 08/16/2014.  FINDINGS: 1545 hour. The heart size and mediastinal contours are stable. The lungs are clear. There is no pleural effusion or pneumothorax. The known osseous metastases are not well visualized. Telemetry leads overlie the chest. Patient is status post left mastectomy.  IMPRESSION: No active cardiopulmonary process.   Electronically Signed   By: Richardean Sale M.D.   On: 10/08/2014 16:05    Microbiology: No results found for this or any previous visit (from the past 240 hour(s)).   Labs: Basic Metabolic Panel:  Recent Labs Lab 10/08/14 1442 10/08/14 2317 10/09/14 0502 10/11/14 0531 10/12/14 0506  NA 136  --  134* 134* 133*  K 4.2  --  4.4 4.0 3.8  CL 101  --  103 105 102  CO2 26  --  22 21* 21*  GLUCOSE 126*  --  151* 126* 141*  BUN 17  --  15 20 15   CREATININE 1.08* 1.10* 0.87 0.79 0.77  CALCIUM 8.7*  --  7.9* 7.5* 7.8*  MG  --   --  2.1  --   --    Liver Function Tests:  Recent Labs Lab 10/08/14 1442 10/09/14 0502  AST 26 23  ALT 11* 11*  ALKPHOS 65 69  BILITOT 0.3 0.5  PROT 6.7 6.2*  ALBUMIN 3.2* 2.8*   No results for input(s): LIPASE, AMYLASE in the last 168 hours. No results for input(s): AMMONIA in the last 168 hours. CBC:  Recent Labs Lab 10/08/14 1442 10/08/14 2317 10/09/14 0502 10/11/14 0531 10/12/14 0506  WBC 3.6 1.8* 2.9* 3.2* 2.8*  NEUTROABS 1.5  --  2.2  --   --   HGB 9.5* 8.9* 8.8* 7.4* 8.9*  HCT 28.1* 26.5* 26.3* 22.0* 26.0*  MCV 95.1 91.7 91.6 93.2 91.5  PLT 108* 100* 104* 90* 99*   Cardiac Enzymes:  Recent Labs Lab 10/09/14 0502  CKTOTAL 53  CKMB 3.9   BNP: BNP (last 3 results) No results for input(s): BNP in the last 8760 hours.  ProBNP (last 3 results) No results for input(s): PROBNP in the last 8760 hours.  CBG:  Recent Labs Lab 10/08/14 1512  GLUCAP 111*       Signed:  Tyerra Loretto  [facility] 10/12/2014, 2:32 PM

## 2014-10-12 NOTE — Progress Notes (Signed)
STROKE TEAM PROGRESS NOTE   HISTORY Diana Ellis is an 81 y.o. female with history of breast cancer with mets to hips an vertebrae -- currently on Ibrance, HTN, MVP, MR, TR, PVCs and HDL. Per son who is in the room, he had spoken to her at 1000 (LKW 10/08/2014 at 1000) and noted she was speaking slow but no slurred words but at 12:45 her other son went over to her house and noted not only slurred words but left leg weakness. They brought her to the hospital. 5 hours later her symptoms cleared. Currently she is having no symptoms. Patient was on ASA in past but was taken off this due to increased risk of bleeding while on Ibrance. Since in hospital she has been placed on ASA 81 mg daily. PLT at current time is 104. Modified Rankin: Rankin Score=0. Patient was not administered TPA secondary to delay in arrival. She was admitted for further evaluation and treatment.   SUBJECTIVE (INTERVAL HISTORY)  .  Overall she feels her condition is stable. She had a TEE yesterday which showed no cardiac source of embolism or clot but small PFO. Lower extremity venous Dopplers are pending.   OBJECTIVE Temp:  [97.6 F (36.4 C)-98.2 F (36.8 C)] 98.2 F (36.8 C) (05/05 1300) Pulse Rate:  [60-115] 90 (05/05 1300) Cardiac Rhythm:  [-] Normal sinus rhythm (05/04 1726) Resp:  [18] 18 (05/05 1300) BP: (144-159)/(65-78) 150/66 mmHg (05/05 1300) SpO2:  [96 %-99 %] 97 % (05/05 1300) Weight:  [112 lb 4.8 oz (50.939 kg)] 112 lb 4.8 oz (50.939 kg) (05/05 0435)   Recent Labs Lab 10/08/14 1512  GLUCAP 111*    Recent Labs Lab 10/08/14 1442 10/08/14 2317 10/09/14 0502 10/11/14 0531 10/12/14 0506  NA 136  --  134* 134* 133*  K 4.2  --  4.4 4.0 3.8  CL 101  --  103 105 102  CO2 26  --  22 21* 21*  GLUCOSE 126*  --  151* 126* 141*  BUN 17  --  15 20 15   CREATININE 1.08* 1.10* 0.87 0.79 0.77  CALCIUM 8.7*  --  7.9* 7.5* 7.8*  MG  --   --  2.1  --   --     Recent Labs Lab 10/08/14 1442 10/09/14 0502  AST  26 23  ALT 11* 11*  ALKPHOS 65 69  BILITOT 0.3 0.5  PROT 6.7 6.2*  ALBUMIN 3.2* 2.8*    Recent Labs Lab 10/08/14 1442 10/08/14 2317 10/09/14 0502 10/11/14 0531 10/12/14 0506  WBC 3.6 1.8* 2.9* 3.2* 2.8*  NEUTROABS 1.5  --  2.2  --   --   HGB 9.5* 8.9* 8.8* 7.4* 8.9*  HCT 28.1* 26.5* 26.3* 22.0* 26.0*  MCV 95.1 91.7 91.6 93.2 91.5  PLT 108* 100* 104* 90* 99*    Recent Labs Lab 10/09/14 0502  CKTOTAL 53  CKMB 3.9   No results for input(s): LABPROT, INR in the last 72 hours. No results for input(s): COLORURINE, LABSPEC, [place], GLUCOSEU, HGBUR, BILIRUBINUR, KETONESUR, PROTEINUR, UROBILINOGEN, NITRITE, LEUKOCYTESUR in the last 72 hours.  Invalid input(s): APPERANCEUR     Component Value Date/Time   CHOL 221* 10/09/2014 0502   TRIG 111 10/09/2014 0502   HDL 57 10/09/2014 0502   CHOLHDL 3.9 10/09/2014 0502   VLDL 22 10/09/2014 0502   LDLCALC 142* 10/09/2014 0502   Lab Results  Component Value Date   HGBA1C 5.7* 10/09/2014   No results found for: LABOPIA, COCAINSCRNUR, LABBENZ, AMPHETMU,  THCU, LABBARB  No results for input(s): ETH in the last 168 hours.  No results found. Carotid Doppler  There is 1-39% bilateral ICA stenosis. Vertebral artery flow is antegrade.    2D Echocardiogram   - Left ventricle: The cavity size was normal. Wall thickness was increased in a pattern of mild LVH. Systolic function wasvigorous. The estimated ejection fraction was in the range of 65%to 70%. Wall motion was normal; there were no regional wallmotion abnormalities. Doppler parameters are consistent withabnormal left ventricular relaxation (grade 1 diastolicdysfunction). - Mitral valve: There was mild regurgitation. - Atrial septum: No defect or patent foramen ovale was identified. - Pulmonary arteries: PA peak pressure: 35 mm Hg (S). Impressions: No cardiac source of emboli was indentified.   PHYSICAL EXAM Frail elderly lady not in distress. . Afebrile. Head is nontraumatic.  Neck is supple without bruit.    Cardiac exam no murmur or gallop. Lungs are clear to auscultation. Distal pulses are well felt. Neurological Exam ; Awake alert oriented x 3 normal speech and language. Mild left lower face asymmetry. Tongue midline. No drift. Mild diminished fine finger movements on left. Orbits right over left upper extremity. Mild left grip and hip flexor weak.. Normal sensation . Normal coordination.gait deferred  ASSESSMENT/PLAN Ms. Diana Ellis is a 81 y.o. female with history of breast cancer with mets to hips an vertebrae -- currently on Ibrance, HTN, MVP, MR, TR, PVCs and HDL presenting with slurred speech and left leg weakness. She did not receive IV t-PA due to delay in arrival.   Stroke:  bilateral frontal, parietal and cerebellar infarcts, appear embolic secondary to unknown source. Likely   hypercoagulable from breast cancer  Resultant  Left hemiparesis, expressive aphasia  MRI   2 x 2.4 cm LEFT inferior frontal lobe mass with imaging characteristics of meningioma, less likely dural metastasis with surrounding vasogenic edema.  Multiple acute bifrontal, biparietal and bilateral cerebellar subcentimeter infarcts likely representing embolic phenomena  MRA  Not done  Carotid Doppler  No significant stenosis   2D Echo  No source of embolus  TEE to look for embolic source. Arranged with [facility] for tomorrow.  If positive for PFO (patent foramen ovale), check bilateral lower extremity venous dopplers to rule out DVT as possible source of stroke. (I have made patient NPO after midnight tonight).  Will not consider for loop as not a good long-term anticoagulation candidate  HgbA1c 5.7  Lovenox 40 mg sq daily for VTE prophylaxis Diet Heart Room service appropriate?: Yes; Fluid consistency:: Thin  no antithrombotic prior to admission, now on aspirin 81 mg orally every day.   Therapy recommendations:  SNF  Disposition:  pending   Breast  Cancer with mets to hips and vertebrae  Hx breast cancer 2011 with high risk of recurrence  No brain mets seen on MRI  Meningioma  Asymptomatic  Incidental finding  No intervention indicated at this time  Hypertension  Unstable  Hyperlipidemia  Home meds:  No statin   LDL 142, goal < 70  Recommend Addition of statin   Other Stroke Risk Factors  Advanced age  Coronary artery disease  Other Active Problems  Pancytopenia  Recent EGD w/ esophageal dilatation  Hospital day # [place] for Pager information 10/12/2014 1:26 PM  I have personally examined this patient, reviewed notes, independently viewed imaging studies, participated in medical decision making and plan of care. I have made any additions  or clarifications directly to the above note.   Check lower extremity venous Dopplers and if negative for DVT continue aspirin. If positive may need to consider anticoagulation which may be tricky given her low platelet count. Kindly call for questions. Stroke team will sign off. Antony Contras, MD Medical Director [facility] Pager: [phone] 10/12/2014 1:26 PM    To contact Stroke Continuity provider, please refer to [email or web address] After hours, contact General Neurology

## 2014-10-12 NOTE — Progress Notes (Signed)
*  PRELIMINARY RESULTS* Vascular Ultrasound Lower extremity venous duplex has been completed.  Preliminary findings: Negative for DVT  Landry Mellow, RDMS, RVT  10/12/2014, 3:21 PM

## 2014-10-12 NOTE — Progress Notes (Signed)
Physical Therapy Treatment Patient Details Name: Diana Ellis MRN: [id number] DOB: 04/02/1934 Today's Date: 10/12/2014    History of Present Illness Diana Ellis is an 81 y.o. female with history of breast cancer with mets to hips an vertebrae -- currently on Ibrance, HTN, MVP, MR, TR, PVCs and HDL. Per son who is in the room, he had spoken to her at 87 and noted she was speaking slow but no slurred words but at 12:45 her other son went over to her house and noted not only slurred words but left leg weakness. They brought her to the hospital. 5 hours later her symptoms cleared. Imaging revealed Multiple acute bifrontal, biparietal and bilateral cerebellar as well as meningioma    PT Comments    Pt progressing towards physical therapy goals. Was able to improve ambulation distance this session and tolerate some therapeutic exercise in chair without complaints of significant fatigue or pain. Pt anticipates d/c to SNF today.   Follow Up Recommendations  SNF     Equipment Recommendations  Cane    Recommendations for Other Services       Precautions / Restrictions Precautions Precautions: Fall Restrictions Weight Bearing Restrictions: No    Mobility  Bed Mobility Overal bed mobility: Needs Assistance Bed Mobility: Supine to Sit     Supine to sit: Min guard     General bed mobility comments: Increased time required  Transfers Overall transfer level: Needs assistance Equipment used: 1 person hand held assist Transfers: Sit to/from Stand Sit to Stand: Min guard         General transfer comment: Close guard for safety. Pt needed steadying assist only but mostly recovered on her own.  Ambulation/Gait Ambulation/Gait assistance: Min assist Ambulation Distance (Feet): 100 Feet Assistive device: 1 person hand held assist Gait Pattern/deviations: Step-through pattern;Decreased stride length;Trunk flexed Gait velocity: Decreased Gait velocity interpretation: Below normal  speed for age/gender General Gait Details: Min assist for unsteadiness. Pt reaching out for objects in the hall to hold to. May benefit from an AD however unsure if she could coordinate it at this point.    Stairs            Wheelchair Mobility    Modified Rankin (Stroke Patients Only)       Balance Overall balance assessment: Needs assistance Sitting-balance support: Feet supported;No upper extremity supported Sitting balance-Leahy Scale: Fair     Standing balance support: No upper extremity supported Standing balance-Leahy Scale: Poor Standing balance comment: Assist for safety                    Cognition Arousal/Alertness: Awake/alert Behavior During Therapy: Flat affect Overall Cognitive Status: Impaired/Different from baseline Area of Impairment: Memory;Attention   Current Attention Level: Alternating Memory: Decreased short-term memory         General Comments: son reports recent memory problems    Exercises General Exercises - Lower Extremity Quad Sets: 10 reps Long Arc Quad: 10 reps Hip ABduction/ADduction: 10 reps    General Comments        Pertinent Vitals/Pain Pain Assessment: No/denies pain    Home Living                      Prior Function            PT Goals (current goals can now be found in the care plan section) Acute Rehab PT Goals Patient Stated Goal: To rehab today PT Goal Formulation: With patient Time For  Goal Achievement: 10/24/14 Potential to Achieve Goals: Good Progress towards PT goals: Progressing toward goals    Frequency  Min 4X/week    PT Plan Current plan remains appropriate    Co-evaluation             End of Session Equipment Utilized During Treatment: Gait belt Activity Tolerance: Patient tolerated treatment well Patient left: in chair;with call bell/phone within reach;with chair alarm set     Time: 4650-3546 PT Time Calculation (min) (ACUTE ONLY): 25 min  Charges:  $Gait  Training: 8-22 mins $Therapeutic Activity: 8-22 mins                    G Codes:      Diana Ellis 2014/10/24, 1:32 PM   Diana Ellis, PT, DPT Acute Rehabilitation Services Pager: [phone]

## 2014-10-13 ENCOUNTER — Other Ambulatory Visit: Payer: Self-pay | Admitting: *Deleted

## 2014-10-13 ENCOUNTER — Inpatient Hospital Stay: Payer: Medicare Other | Attending: Oncology

## 2014-10-13 DIAGNOSIS — C7951 Secondary malignant neoplasm of bone: Secondary | ICD-10-CM | POA: Insufficient documentation

## 2014-10-13 DIAGNOSIS — Z170 Estrogen receptor positive status [ER+]: Secondary | ICD-10-CM | POA: Insufficient documentation

## 2014-10-13 DIAGNOSIS — I10 Essential (primary) hypertension: Secondary | ICD-10-CM | POA: Diagnosis not present

## 2014-10-13 DIAGNOSIS — C50919 Malignant neoplasm of unspecified site of unspecified female breast: Secondary | ICD-10-CM | POA: Insufficient documentation

## 2014-10-13 DIAGNOSIS — Z79818 Long term (current) use of other agents affecting estrogen receptors and estrogen levels: Secondary | ICD-10-CM | POA: Diagnosis not present

## 2014-10-13 DIAGNOSIS — Z79899 Other long term (current) drug therapy: Secondary | ICD-10-CM | POA: Diagnosis not present

## 2014-10-13 LAB — COMPREHENSIVE METABOLIC PANEL
ALT: 14 U/L (ref 14–54)
AST: 22 U/L (ref 15–41)
Albumin: 3.1 g/dL — ABNORMAL LOW (ref 3.5–5.0)
Alkaline Phosphatase: 63 U/L (ref 38–126)
Anion gap: 9 (ref 5–15)
BUN: 29 mg/dL — ABNORMAL HIGH (ref 6–20)
CHLORIDE: 102 mmol/L (ref 101–111)
CO2: 22 mmol/L (ref 22–32)
CREATININE: 0.96 mg/dL (ref 0.44–1.00)
Calcium: 8.1 mg/dL — ABNORMAL LOW (ref 8.9–10.3)
GFR calc Af Amer: >60 mL/min (ref >60)
GFR, EST NON AFRICAN AMERICAN: 55 mL/min — AB (ref >60)
Glucose, Bld: 145 mg/dL — ABNORMAL HIGH (ref 65–99)
Potassium: 3.8 mmol/L (ref 3.5–5.1)
SODIUM: 133 mmol/L — AB (ref 135–145)
Total Bilirubin: 0.4 mg/dL (ref 0.3–1.2)
Total Protein: 6.3 g/dL — ABNORMAL LOW (ref 6.5–8.1)

## 2014-10-13 LAB — CBC WITH DIFFERENTIAL/PLATELET
Basophils Absolute: 0 10*3/uL (ref 0–0.1)
Basophils Relative: 0 %
EOS PCT: 0 %
Eosinophils Absolute: 0 10*3/uL (ref 0–0.7)
HCT: 28.9 % — ABNORMAL LOW (ref 35.0–47.0)
Hemoglobin: 9.8 g/dL — ABNORMAL LOW (ref 12.0–16.0)
LYMPHS ABS: 0.8 10*3/uL — AB (ref 1.0–3.6)
Lymphocytes Relative: 25 %
MCH: 32.1 pg (ref 26.0–34.0)
MCHC: 33.8 g/dL (ref 32.0–36.0)
MCV: 95.1 fL (ref 80.0–100.0)
MONO ABS: 0.2 10*3/uL (ref 0.2–0.9)
MONOS PCT: 7 %
NEUTROS ABS: 2.1 10*3/uL (ref 1.4–6.5)
Neutrophils Relative %: 68 %
Platelets: 101 10*3/uL — ABNORMAL LOW (ref 150–440)
RBC: 3.04 MIL/uL — AB (ref 3.80–5.20)
RDW: 27 % — ABNORMAL HIGH (ref 11.5–14.5)
WBC: 3 10*3/uL — AB (ref 3.6–11.0)

## 2014-10-14 LAB — CANCER ANTIGEN 27.29: CA 27.29: 713.2 U/mL — ABNORMAL HIGH (ref 0.0–38.6)

## 2014-10-16 ENCOUNTER — Other Ambulatory Visit: Payer: Medicare Other

## 2014-10-17 ENCOUNTER — Other Ambulatory Visit: Payer: Self-pay

## 2014-10-17 ENCOUNTER — Inpatient Hospital Stay (HOSPITAL_BASED_OUTPATIENT_CLINIC_OR_DEPARTMENT_OTHER): Payer: Medicare Other | Admitting: Oncology

## 2014-10-17 ENCOUNTER — Inpatient Hospital Stay: Payer: Medicare Other

## 2014-10-17 VITALS — BP 136/70 | HR 99 | Temp 97.4°F | Wt 103.2 lb

## 2014-10-17 DIAGNOSIS — D649 Anemia, unspecified: Secondary | ICD-10-CM | POA: Diagnosis not present

## 2014-10-17 DIAGNOSIS — Z170 Estrogen receptor positive status [ER+]: Secondary | ICD-10-CM | POA: Diagnosis not present

## 2014-10-17 DIAGNOSIS — Z79811 Long term (current) use of aromatase inhibitors: Secondary | ICD-10-CM

## 2014-10-17 DIAGNOSIS — C7951 Secondary malignant neoplasm of bone: Secondary | ICD-10-CM

## 2014-10-17 DIAGNOSIS — C50919 Malignant neoplasm of unspecified site of unspecified female breast: Secondary | ICD-10-CM | POA: Diagnosis not present

## 2014-10-17 DIAGNOSIS — I10 Essential (primary) hypertension: Secondary | ICD-10-CM

## 2014-10-19 ENCOUNTER — Ambulatory Visit: Payer: Medicare Other | Admitting: Oncology

## 2014-10-20 ENCOUNTER — Other Ambulatory Visit: Payer: Self-pay | Admitting: *Deleted

## 2014-10-20 DIAGNOSIS — C50919 Malignant neoplasm of unspecified site of unspecified female breast: Secondary | ICD-10-CM

## 2014-10-20 MED ORDER — ROPINIROLE HCL 1 MG PO TABS: 1.0000 mg | ORAL_TABLET | Freq: Two times a day (BID) | ORAL | Status: AC

## 2014-10-23 ENCOUNTER — Telehealth: Payer: Self-pay | Admitting: *Deleted

## 2014-10-23 NOTE — Telephone Encounter (Signed)
[place] and spoke with [place], Therapist, sports. Gave VO from Dr. Oliva Bustard to increase Requip to BID. Readback of order performed.

## 2014-11-01 ENCOUNTER — Other Ambulatory Visit: Payer: Self-pay | Admitting: *Deleted

## 2014-11-02 ENCOUNTER — Inpatient Hospital Stay: Payer: Medicare Other

## 2014-11-02 ENCOUNTER — Inpatient Hospital Stay (HOSPITAL_BASED_OUTPATIENT_CLINIC_OR_DEPARTMENT_OTHER): Payer: Medicare Other | Admitting: Oncology

## 2014-11-02 VITALS — BP 152/75 | HR 97 | Temp 95.1°F | Wt 102.3 lb

## 2014-11-02 DIAGNOSIS — I10 Essential (primary) hypertension: Secondary | ICD-10-CM

## 2014-11-02 DIAGNOSIS — C7951 Secondary malignant neoplasm of bone: Secondary | ICD-10-CM

## 2014-11-02 DIAGNOSIS — Z79811 Long term (current) use of aromatase inhibitors: Secondary | ICD-10-CM

## 2014-11-02 DIAGNOSIS — C50919 Malignant neoplasm of unspecified site of unspecified female breast: Secondary | ICD-10-CM

## 2014-11-02 DIAGNOSIS — Z170 Estrogen receptor positive status [ER+]: Secondary | ICD-10-CM

## 2014-11-02 DIAGNOSIS — D649 Anemia, unspecified: Secondary | ICD-10-CM | POA: Diagnosis not present

## 2014-11-02 LAB — CBC WITH DIFFERENTIAL/PLATELET
BASOS PCT: 0 %
Basophils Absolute: 0 10*3/uL (ref 0–0.1)
Eosinophils Absolute: 0 10*3/uL (ref 0–0.7)
Eosinophils Relative: 0 %
HCT: 32 % — ABNORMAL LOW (ref 35.0–47.0)
HEMOGLOBIN: 10.7 g/dL — AB (ref 12.0–16.0)
Lymphocytes Relative: 19 %
Lymphs Abs: 0.7 10*3/uL — ABNORMAL LOW (ref 1.0–3.6)
MCH: 32.2 pg (ref 26.0–34.0)
MCHC: 33.3 g/dL (ref 32.0–36.0)
MCV: 96.9 fL (ref 80.0–100.0)
MONOS PCT: 5 %
Monocytes Absolute: 0.2 10*3/uL (ref 0.2–0.9)
NEUTROS ABS: 2.8 10*3/uL (ref 1.4–6.5)
Neutrophils Relative %: 76 %
PLATELETS: 126 10*3/uL — AB (ref 150–440)
RBC: 3.3 MIL/uL — ABNORMAL LOW (ref 3.80–5.20)
RDW: 25.5 % — ABNORMAL HIGH (ref 11.5–14.5)
WBC: 3.7 10*3/uL (ref 3.6–11.0)

## 2014-11-02 LAB — COMPREHENSIVE METABOLIC PANEL
ALT: 17 U/L (ref 14–54)
ANION GAP: 5 (ref 5–15)
AST: 29 U/L (ref 15–41)
Albumin: 2.7 g/dL — ABNORMAL LOW (ref 3.5–5.0)
Alkaline Phosphatase: 66 U/L (ref 38–126)
BUN: 27 mg/dL — ABNORMAL HIGH (ref 6–20)
CALCIUM: 7.1 mg/dL — AB (ref 8.9–10.3)
CHLORIDE: 99 mmol/L — AB (ref 101–111)
CO2: 24 mmol/L (ref 22–32)
Creatinine, Ser: 0.9 mg/dL (ref 0.44–1.00)
GFR calc Af Amer: >60 mL/min (ref >60)
GFR calc non Af Amer: 60 mL/min — ABNORMAL LOW (ref >60)
GLUCOSE: 126 mg/dL — AB (ref 65–99)
Potassium: 3.6 mmol/L (ref 3.5–5.1)
SODIUM: 128 mmol/L — AB (ref 135–145)
Total Bilirubin: 0.4 mg/dL (ref 0.3–1.2)
Total Protein: 5.1 g/dL — ABNORMAL LOW (ref 6.5–8.1)

## 2014-11-03 ENCOUNTER — Encounter: Payer: Self-pay | Admitting: Oncology

## 2014-11-03 DIAGNOSIS — M1990 Unspecified osteoarthritis, unspecified site: Secondary | ICD-10-CM | POA: Insufficient documentation

## 2014-11-03 NOTE — Progress Notes (Signed)
[place] @ [facility] [phone]) [phone]  Fax:([phone]) Shelley OB: 1934-02-16  MR#: [id number]  ZLD#:[id number]  Patient Care Team: [name], MD as PCP - General (Family Medicine) Robert Bellow, MD (General Surgery) Einar Pheasant, MD (Internal Medicine)  CHIEF COMPLAINT:  Chief Complaint  Patient presents with  . Follow-up    Oncology History   Chief Complaint/Diagnosis:   1. Carcinoma of breast status post mastectomy and sentinel lymph node biopsy. Tumor size 2.5 cm lobular carcinoma ER/PR positive, HER-2 receptor 1+ and MammoPrint high risk. 2.  Arimidex was discontinued because of joint pains in May of 2012 3.  Tamoxifen has been started in May of 2012 4.significant weight loss 20 pounds since Christmas (2015) Abnormal CT scan with diffuse bone marrow uptake PET scan, MRI scan, high cA2 7.29 is suggestive of recurrent carcinoma of breast (March, 2016) 5.  Nanci Pina (March, 2016) 6.  Progressing disease by PET scan and tumor marker criteria patient is off all treatment at present time (May, 2016)     Breast cancer   07/21/2013 Initial Diagnosis Breast cancer    Bone metastases   10/08/2014 Initial Diagnosis Bone metastases    Oncology Flowsheet 10/09/2014 10/09/2014 10/10/2014 10/10/2014 10/11/2014 10/11/2014 10/12/2014  dexamethasone (DECADRON) IV 4 mg   - - - - -  dexamethasone (DECADRON) PO 4 mg 4 mg 4 mg 4 mg 4 mg 4 mg 4 mg  enoxaparin (LOVENOX) [place] 40 mg   40 mg   40 mg   -    INTERVAL HISTORY: 81 year old lady with history of carcinoma of breast metastases to the bone.  Progressive declining condition.  Patient is in assisted living facility.  The patient desires to consider further chemotherapy.  Family did not think that patient can go through chemotherapy.  Patient wanted another opinion and is decided to come and talk to me. Aggressive declining condition. Patient was admitted in the hospital recently and was found to have possibility  of meningeal metastases versus bone metastases in the skull.  On steroid In wheelchair REVIEW OF SYSTEMS:   Gen. status: Declining condition and performance status HEENT: No headache.  No hearing loss.  No ear pain.  No nosebleed or congestion.  No sore throat.  No difficulty swallowing Lungs: Shortness of breath Cardiac: No shortness of breath or paroxysmal nocturnal dyspnea Gastro intestinal system: No heartburn.  No nausea or vomiting.  No abdominal pain.  No diarrhea.  No constipation.  No rectal bleeding. Skin: No evidence of ecchymosis or rash. Musculoskeletal system: Joint pains in bone pains Lower extremity in Skin: No evidence of ecchymosis or rash. Neurological system: No localizing sign  As per HPI. Otherwise, a complete review of systems is negatve.  PAST MEDICAL HISTORY: Past Medical History  Diagnosis Date  . Allergy   . Hypertension 2006  . Arthritis   . Bursitis   . Cardiac disease   . Heart murmur   . Cancer   . Malignant neoplasm of breast (female), unspecified site June 21, 2009    T2, N1a, M0; ER 25%, PR 50%, HER-2/neu not over expressed. Mammoprint: High risk for recurrent. Oncology Protocol: Patient is on Research Protocol E 5103 and will receive AC every 3 weeks x 4, then Taxol weekly x 12 treatments and Avastin vs Placebo through the [facility] and Taxol portion of trial.    PAST SURGICAL HISTORY: Past Surgical History  Procedure Laterality Date  . Colonoscopy  2004    Dr  Elliott  . Tracheal surgery  2012  . Port a cath revision  2011  . Upper gi endoscopy    . Abdominal hysterectomy  1983  . Bronchoscopy  2002  . Port-a-cath removal  2012  . Tonsillectomy  1957  . Breast surgery Left June 21, 2009    MASTECTOMY,, sentinel node biopsy  . Esophageal dilation  07/28/14.     Dr. Vira Agar  . Tee without cardioversion N/A 10/11/2014    Procedure: TRANSESOPHAGEAL ECHOCARDIOGRAM (TEE);  Surgeon: Thayer Headings, MD;  Location: [facility] ENDOSCOPY;  Service:  Cardiovascular;  Laterality: N/A;    FAMILY HISTORY Family History  Problem Relation Age of Onset  . Cancer Mother     lung     Oncology Protocol: Patient is on Research Protocol E [phone] and will receive AC every 3 weeks x 4, then Taxol weekly x 12 treatments and Avastin vs Placebo through the [facility] and Taxol portion of trial.   Hypertension:    left mastectomy:    Tracheostomy: 2000   Hysterectomy - Total: Sep 1983  Preventive Screening:  Has patient had any of the following test? Mammography (1)   Last Mammography: Feb 2015(1)   PFSH: Family History: Patient niece had a breast cancer, mother had lung cancer  Comments: Does not smoke does not drink  Additional Past Medical and Surgical History: Hysterectomy in 1983   Mastectomy in 2011  High blood pressure   History of tracheal stricture   History of murmur   GYNECOLOGIC HISTORY:  No LMP recorded. Patient has had a hysterectomy.     ADVANCED DIRECTIVES: Patient does have advanced health care directive   HEALTH MAINTENANCE: History  Substance Use Topics  . Smoking status: Never Smoker   . Smokeless tobacco: Never Used  . Alcohol Use: No      Allergies  Allergen Reactions  . Codeine Nausea Only    Current Outpatient Prescriptions  Medication Sig Dispense Refill  . aspirin EC 81 MG EC tablet Take 1 tablet (81 mg total) by mouth daily.    . calcium carbonate (OS-CAL) 600 MG TABS tablet Take 600 mg by mouth daily with breakfast.    . cholecalciferol (VITAMIN D) 1000 UNITS tablet Take 1,000 Units by mouth daily.    Marland Kitchen dexamethasone (DECADRON) 4 MG tablet Take 1 tablet (4 mg total) by mouth every 12 (twelve) hours. 30 tablet 0  . gabapentin (NEURONTIN) 300 MG capsule Take 300 mg by mouth 3 (three) times daily.    . Glucosamine-Chondroit-Vit C-Mn (GLUCOSAMINE 1500 COMPLEX) CAPS Take by mouth daily.    Javier Docker Oil 300 MG CAPS Take 300 mg by mouth daily.    Marland Kitchen MAGNESIUM ASPARTATE PO Take 1 tablet by mouth daily.      Marland Kitchen omeprazole (PRILOSEC) 20 MG capsule Take 20 mg by mouth 2 (two) times daily before a meal.    . rOPINIRole (REQUIP) 1 MG tablet Take 1 tablet (1 mg total) by mouth 2 (two) times daily.    Marland Kitchen acetaminophen (TYLENOL ARTHRITIS PAIN) 650 MG CR tablet Take 1,300 mg by mouth at bedtime.      No current facility-administered medications for this visit.    OBJECTIVE:  Filed Vitals:   11/02/14 1557  BP: 152/75  Pulse: 97  Temp: 95.1 F (35.1 C)     Body mass index is 17.02 kg/(m^2).    ECOG FS:2 - Symptomatic, <50% confined to bed  PHYSICAL EXAM: Gen. status: Poor performance status patient is in wheelchair  Neurologically, the patient was awake, alert, and oriented to person, place and time. There were no obvious focal neurologic abnormalities. Abdominal exam revealed normal bowel sounds. The abdomen was soft, non-tender, and without masses, organomegaly, or appreciable enlargement of the abdominal aorta. HEENT: No ulcers in the mouth.  No palpable masses.  Within normal limit Lungs: Air entry diminished on both sides no palpitation.  No rhonchi.  No rales Lower extremity swelling Skin: No rash Neurological system: Higher functions within normal limit No focal sign Lymphatic system: No palpable supraclavicular cervical axillary or adenopathy   LAB RESULTS:  Appointment on 11/02/2014  Component Date Value Ref Range Status  . WBC 11/02/2014 3.7  3.6 - 11.0 K/uL Final  . RBC 11/02/2014 3.30* 3.80 - 5.20 MIL/uL Final  . Hemoglobin 11/02/2014 10.7* 12.0 - 16.0 g/dL Final  . HCT 11/02/2014 32.0* 35.0 - 47.0 % Final  . MCV 11/02/2014 96.9  80.0 - 100.0 fL Final  . MCH 11/02/2014 32.2  26.0 - 34.0 pg Final  . MCHC 11/02/2014 33.3  32.0 - 36.0 g/dL Final  . RDW 11/02/2014 25.5* 11.5 - 14.5 % Final  . Platelets 11/02/2014 126* 150 - 440 K/uL Final  . Neutrophils Relative % 11/02/2014 76   Final  . Neutro Abs 11/02/2014 2.8  1.4 - 6.5 K/uL Final  . Lymphocytes Relative 11/02/2014 19    Final  . Lymphs Abs 11/02/2014 0.7* 1.0 - 3.6 K/uL Final  . Monocytes Relative 11/02/2014 5   Final  . Monocytes Absolute 11/02/2014 0.2  0.2 - 0.9 K/uL Final  . Eosinophils Relative 11/02/2014 0   Final  . Eosinophils Absolute 11/02/2014 0.0  0 - 0.7 K/uL Final  . Basophils Relative 11/02/2014 0   Final  . Basophils Absolute 11/02/2014 0.0  0 - 0.1 K/uL Final  . Sodium 11/02/2014 128* 135 - 145 mmol/L Final  . Potassium 11/02/2014 3.6  3.5 - 5.1 mmol/L Final  . Chloride 11/02/2014 99* 101 - 111 mmol/L Final  . CO2 11/02/2014 24  22 - 32 mmol/L Final  . Glucose, Bld 11/02/2014 126* 65 - 99 mg/dL Final  . BUN 11/02/2014 27* 6 - 20 mg/dL Final  . Creatinine, Ser 11/02/2014 0.90  0.44 - 1.00 mg/dL Final  . Calcium 11/02/2014 7.1* 8.9 - 10.3 mg/dL Final  . Total Protein 11/02/2014 5.1* 6.5 - 8.1 g/dL Final  . Albumin 11/02/2014 2.7* 3.5 - 5.0 g/dL Final  . AST 11/02/2014 29  15 - 41 U/L Final  . ALT 11/02/2014 17  14 - 54 U/L Final  . Alkaline Phosphatase 11/02/2014 66  38 - 126 U/L Final  . Total Bilirubin 11/02/2014 0.4  0.3 - 1.2 mg/dL Final  . GFR calc non Af Amer 11/02/2014 60* >60 mL/min Final  . GFR calc Af Amer 11/02/2014 >60  >60 mL/min Final   Comment: (NOTE) The eGFR has been calculated using the CKD EPI equation. This calculation has not been validated in all clinical situations. eGFR's persistently <60 mL/min signify possible Chronic Kidney Disease.   . Anion gap 11/02/2014 5  5 - 15 Final    Lab Results  Component Value Date   LABCA2 713.2* 10/13/2014     STUDIES: Ct Head Wo Contrast  10/08/2014   CLINICAL DATA:  Code stroke, slurred speech, history of metastatic breast cancer  EXAM: CT HEAD WITHOUT CONTRAST  TECHNIQUE: Contiguous axial images were obtained from the base of the skull through the vertex without intravenous contrast.  COMPARISON:  None.  FINDINGS: Hypodensity in the subcortical left frontal lobe (series 2/image 10), measuring 3.5 x 3.8 cm. While  ischemic stroke is possible, the appearance is worrisome for vasogenic edema related to metastasis.  No evidence of parenchymal hemorrhage or extra-axial fluid collection.  No midline shift.  No CT evidence of acute infarction.  Subcortical white matter and periventricular small vessel ischemic changes.  Cerebral volume is within normal limits.  No ventriculomegaly.  The visualized paranasal sinuses are essentially clear. The mastoid air cells are unopacified.  No fracture or dislocation is seen.  IMPRESSION: Subcortical hypodensity in the left frontal lobe, measuring 3.5 x 3.8 cm, worrisome for metastasis, less likely acute ischemic stroke.  Consider MRI brain with contrast for further evaluation as clinically warranted.  These results were called by telephone at the time of interpretation on 10/08/2014 at 3:31 pm to Dr. [name] , who verbally acknowledged these results.   Electronically Signed   By: Julian Hy M.D.   On: 10/08/2014 15:34   Ct Chest W Contrast  10/09/2014   CLINICAL DATA:  Breast cancer.  Weakness.  Skeletal metastasis.  EXAM: CT CHEST, ABDOMEN, AND PELVIS WITH CONTRAST  TECHNIQUE: Multidetector CT imaging of the chest, abdomen and pelvis was performed following the standard protocol during bolus administration of intravenous contrast.  CONTRAST:  30m OMNIPAQUE IOHEXOL 300 MG/ML  SOLN  COMPARISON:  None.  FINDINGS: CT CHEST FINDINGS  Mediastinum/Nodes: No axillary or supraclavicular lymphadenopathy. No mediastinal hilar lymphadenopathy. No central pulmonary embolism. No pericardial fluid. Esophagus normal.  Lungs/Pleura: Subpleural nodule in the right middle lobe measures 2 mm (image 37, series 3.  CT ABDOMEN AND PELVIS FINDINGS  Hepatobiliary: Hyper enhancing lesion in the subcapsular right hepatic lobe measures 15 mm on image 56, series 2. A second lesion in the right hepatic lobe measures 10 mm on image 54. There are 2 hypervascular lesions in the left hepatic lobe on image 56 and  51. These lesions persists on the delayed imaging suggesting hemangioma. No biliary duct dilatation. Gallbladder is normal.  Pancreas: Pancreas is normal. No ductal dilatation. No pancreatic inflammation.  Spleen: Normal spleen  Adrenals/urinary tract: Adrenal glands and kidneys are normal. The ureters and bladder normal.  Stomach/Bowel: Stomach, small bowel, appendix, and cecum are normal. The colon and rectosigmoid colon are normal. There are diverticula sigmoid colon without acute inflammation.  Vascular/Lymphatic: Abdominal aorta is normal caliber. There is no retroperitoneal or periportal lymphadenopathy. No pelvic lymphadenopathy.  Reproductive: Post hysterectomy anatomy.  No adnexal abnormality.  Musculoskeletal: There multiple sclerotic lesions throughout the pelvis sacrum. Multiple sclerotic lesions throughout the entirety of the cervical, thoracic and lumbar spine. No pathologic fracture addendum  Other: No free fluid.  IMPRESSION: Chest :  1. No evidence of pulmonary metastasis or adenopathy. 2. Small subpleural nodule in the right middle lobe is likely benign.  Abdomen / Pelvis Impression:  1. No evidence of soft tissue metastasis within the abdomen pelvis. 2. Hypervascular lesions within the liver are likely benign flash filling hemangiomas. Further characterization could be achieved with abdominal MRI with without contrast. 3. Widespread sclerotic skeletal metastasis within the spine and pelvis.   Electronically Signed   By: SSuzy BouchardM.D.   On: 10/09/2014 01:33   Mr BJeri CosWSTContrast  10/08/2014   CLINICAL DATA:  Speech disturbance noted early this afternoon, now resolved. LEFT-sided weakness, worse than generalized weakness for the last 2 months. History of breast cancer, diffuse metastasis.  EXAM: MRI HEAD WITHOUT CONTRAST  TECHNIQUE: Multiplanar,  multiecho pulse sequences of the brain and surrounding structures were obtained without intravenous contrast.  COMPARISON:  CT of the head Oct 08, 2014 at 1519 hours and head CT August 16, 2014  FINDINGS: Low T2, intermediate to low T1 homogeneously enhancing 2 x 2.4 cm mass LEFT inferior frontal lobe, with slight apparent dural tail. Surrounding T2 bright vasogenic edema and local sulcal effacement, without midline shift. No additional suspicious intracranial enhancement.  Multiple subcentimeter foci of reduced diffusion bilateral inferior cerebellum, with corresponding low ADC values. Similar subcentimeter foci and posterior frontal lobes and parietal lobes bilaterally. Punctate focus of susceptibility artifact in LEFT frontal lobe. The ventricles and sulci are normal for patient's age. Scattered subcentimeter supratentorial white matter FLAIR T2 hyperintensities exclusive of the aforementioned foci of acute ischemia. Tiny bilateral basal ganglia perivascular spaces, unlikely to reflect the lacunar infarcts.  No abnormal extra-axial fluid collections. Normal major intracranial vascular flow voids seen at the skull base. Scattered small mucosal retention cyst without paranasal sinus air-fluid levels. Minimal RIGHT mastoid effusion. Ocular globes and orbital contents appear normal though not tailored for evaluation. No abnormal sellar expansion. No cerebellar tonsillar ectopia. Abnormal low T1 signal in the LEFT frontal calvarium without enhancement, unlikely to reflect metastasis.  IMPRESSION: 2 x 2.4 cm LEFT inferior frontal lobe mass with imaging characteristics of meningioma, less likely dural metastasis with surrounding vasogenic edema.  Multiple acute bifrontal, biparietal and bilateral cerebellar subcentimeter infarcts likely representing embolic phenomena.   Electronically Signed   By: Elon Alas   On: 10/08/2014 22:36   Ct Abdomen Pelvis W Contrast  10/09/2014   CLINICAL DATA:  Breast cancer.  Weakness.  Skeletal metastasis.  EXAM: CT CHEST, ABDOMEN, AND PELVIS WITH CONTRAST  TECHNIQUE: Multidetector CT imaging of the chest, abdomen and  pelvis was performed following the standard protocol during bolus administration of intravenous contrast.  CONTRAST:  29m OMNIPAQUE IOHEXOL 300 MG/ML  SOLN  COMPARISON:  None.  FINDINGS: CT CHEST FINDINGS  Mediastinum/Nodes: No axillary or supraclavicular lymphadenopathy. No mediastinal hilar lymphadenopathy. No central pulmonary embolism. No pericardial fluid. Esophagus normal.  Lungs/Pleura: Subpleural nodule in the right middle lobe measures 2 mm (image 37, series 3.  CT ABDOMEN AND PELVIS FINDINGS  Hepatobiliary: Hyper enhancing lesion in the subcapsular right hepatic lobe measures 15 mm on image 56, series 2. A second lesion in the right hepatic lobe measures 10 mm on image 54. There are 2 hypervascular lesions in the left hepatic lobe on image 56 and 51. These lesions persists on the delayed imaging suggesting hemangioma. No biliary duct dilatation. Gallbladder is normal.  Pancreas: Pancreas is normal. No ductal dilatation. No pancreatic inflammation.  Spleen: Normal spleen  Adrenals/urinary tract: Adrenal glands and kidneys are normal. The ureters and bladder normal.  Stomach/Bowel: Stomach, small bowel, appendix, and cecum are normal. The colon and rectosigmoid colon are normal. There are diverticula sigmoid colon without acute inflammation.  Vascular/Lymphatic: Abdominal aorta is normal caliber. There is no retroperitoneal or periportal lymphadenopathy. No pelvic lymphadenopathy.  Reproductive: Post hysterectomy anatomy.  No adnexal abnormality.  Musculoskeletal: There multiple sclerotic lesions throughout the pelvis sacrum. Multiple sclerotic lesions throughout the entirety of the cervical, thoracic and lumbar spine. No pathologic fracture addendum  Other: No free fluid.  IMPRESSION: Chest :  1. No evidence of pulmonary metastasis or adenopathy. 2. Small subpleural nodule in the right middle lobe is likely benign.  Abdomen / Pelvis Impression:  1. No evidence of soft tissue metastasis within the abdomen  pelvis.  2. Hypervascular lesions within the liver are likely benign flash filling hemangiomas. Further characterization could be achieved with abdominal MRI with without contrast. 3. Widespread sclerotic skeletal metastasis within the spine and pelvis.   Electronically Signed   By: Suzy Bouchard M.D.   On: 10/09/2014 01:33   Dg Chest Portable 1 View  10/08/2014   CLINICAL DATA:  Confusion today. History of metastatic breast cancer. Initial encounter.  EXAM: PORTABLE CHEST - 1 VIEW  COMPARISON:  Radiographs 08/02/2009. Chest CT 08/03/2014 and PET-CT 08/16/2014.  FINDINGS: 1545 hour. The heart size and mediastinal contours are stable. The lungs are clear. There is no pleural effusion or pneumothorax. The known osseous metastases are not well visualized. Telemetry leads overlie the chest. Patient is status post left mastectomy.  IMPRESSION: No active cardiopulmonary process.   Electronically Signed   By: Richardean Sale M.D.   On: 10/08/2014 16:05    ASSESSMENT: 81 year old lady with stage IV carcinoma of breast progressing disease on Faslodex and IBRANCE. Patient was admitted in the hospital recently and was transferred to rehabilitation where patient was transferred to [facility] assisted living facility  MEDICAL DECISION MAKING:  All lab data has been reviewed At present time basis performance status is poor.  Patient is a strong desire to try chemotherapy I discussed pros and cons of treatment also palliative care options. Value of chemotherapy in a patient with poor performance status remains unproven The patient strongly desires to try treatment and possibility of Abraxane can be considered. Total duration of visit was 55 minutes.  50% or more time was spent in counseling patient and family regarding prognosis and options of treatment and available resources Recent in family to further discuss whether to go on chemotherapy or not and will let me know  Patient expressed understanding and was  in agreement with this plan. She also understands that She can call clinic at any time with any questions, concerns, or complaints.    No matching staging information was found for the patient.  Forest Gleason, MD   11/03/2014 4:48 PM

## 2014-11-05 ENCOUNTER — Encounter: Payer: Self-pay | Admitting: Oncology

## 2014-11-05 NOTE — Progress Notes (Signed)
[place] @ [facility] [phone]  Fax:([phone]) [place] OB: [birth year]  MR#: [id number]  JJO#:[id number]  Patient Care Team: [name], MD as PCP - General (Family Medicine) Robert Bellow, MD (General Surgery) Einar Pheasant, MD (Internal Medicine)  CHIEF COMPLAINT:  Chief Complaint  Patient presents with  . Follow-up    Oncology History   Chief Complaint/Diagnosis:   1. Carcinoma of breast status post mastectomy and sentinel lymph node biopsy. Tumor size 2.5 cm lobular carcinoma ER/PR positive, HER-2 receptor 1+ and MammoPrint high risk. 2.  Arimidex was discontinued because of joint pains in May of 2012 3.  Tamoxifen has been started in May of 2012 4.significant weight loss 20 pounds since Christmas (2015) Abnormal CT scan with diffuse bone marrow uptake PET scan, MRI scan, high cA2 7.29 is suggestive of recurrent carcinoma of breast (March, 2016) 5.  Nanci Pina (March, 2016) 6.  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  Came today for the follow-up.  Patient was [facility] in [facility] with slurred speech.  Patient had extensive neurological workup which we will possibility of bone metastases or dural metastases or meningioma with a lot of swelling and vasogenic edema.  Which responded to Decadron.  Patient was transferred to rehabilitation but was not making any progress in overall  general condition.  Patient and family here for further evaluation and treatment consideration   REVIEW OF SYSTEMS:   Gen. status: Declining performance status. HEENT: Headache.  No difficulty swallowing.  No soreness in the mouth Cardiovascular system: No chest pain.  No palpitation.  No paroxysmal nocturnal dyspnea. Respiratory system: No cough.  No hemoptysis.  No shortness of breath at rest or exertion.  No chest pain. Gastro intestinal system: No heartburn.  No nausea or vomiting.  No abdominal pain.  No diarrhea.  No constipation.  No rectal bleeding. Genitourinary system: No dysuria.  No hematuria.  No frequency.  No flank pain. Neurological system: Declining performance status.  Difficulty in walking. Lower extremity being Skin: No evidence of ecchymosis or rash.  As per HPI. Otherwise, a complete review of systems is negatve.  PAST MEDICAL HISTORY: Past Medical History  Diagnosis Date  . Allergy   . Hypertension 2006  . Arthritis   . Bursitis   . Cardiac disease   . Heart murmur   . Cancer   . Malignant neoplasm of breast (female), unspecified site June 21, 2009    T2, N1a, M0; ER 25%, PR 50%, HER-2/neu not over expressed. Mammoprint: High risk for recurrent. Oncology Protocol: Patient is on Research Protocol E 5103 and will receive AC every 3 weeks x 4, then Taxol weekly x 12 treatments and Avastin vs Placebo through the [facility] and Taxol portion of trial.    PAST SURGICAL HISTORY: Past Surgical History  Procedure Laterality Date  . Colonoscopy  2004    Dr Vira Agar  . Tracheal surgery  2012  . Port a cath revision  2011  . Upper gi endoscopy    . Abdominal hysterectomy  1983  . Bronchoscopy  2002  . Port-a-cath removal  2012  . Tonsillectomy  1957  . Breast surgery Left June 21, 2009    MASTECTOMY,, sentinel node biopsy  . Esophageal dilation  07/28/14.     Dr. Vira Agar  . Tee without cardioversion N/A 10/11/2014    Procedure: TRANSESOPHAGEAL ECHOCARDIOGRAM  (TEE);  Surgeon: Thayer Headings, MD;  Location: [facility] ENDOSCOPY;  Service: Cardiovascular;  Laterality: N/A;    FAMILY HISTORY Family History  Problem Relation Age of Onset  . Cancer Mother     lung    GYNECOLOGIC HISTORY:  No LMP recorded. Patient has had a hysterectomy.     ADVANCED DIRECTIVES: Patient does have advanced health care directive   HEALTH MAINTENANCE: History  Substance Use Topics  . Smoking status: Never Smoker   . Smokeless tobacco: Never Used  . Alcohol Use: No      Allergies  Allergen Reactions  . Codeine Nausea Only    Current Outpatient Prescriptions  Medication Sig Dispense Refill  . acetaminophen (TYLENOL ARTHRITIS PAIN) 650 MG CR tablet Take 1,300 mg by mouth at bedtime.     Marland Kitchen aspirin EC 81 MG EC tablet Take 1 tablet (81 mg total) by mouth daily.    . calcium carbonate (OS-CAL) 600 MG TABS tablet Take 600 mg by mouth daily with breakfast.    . cholecalciferol (VITAMIN D) 1000 UNITS tablet Take 1,000 Units by mouth daily.    Marland Kitchen dexamethasone (DECADRON) 4 MG tablet Take 1 tablet (4 mg total) by mouth every 12 (twelve) hours. 30 tablet 0  . gabapentin (NEURONTIN) 300 MG capsule Take 300 mg by mouth 3 (three) times daily.    . Glucosamine-Chondroit-Vit C-Mn (GLUCOSAMINE 1500 COMPLEX) CAPS Take by mouth daily.    Javier Docker Oil 300 MG CAPS Take 300 mg by mouth daily.    Marland Kitchen MAGNESIUM ASPARTATE PO Take 1 tablet by mouth daily.    Marland Kitchen omeprazole (PRILOSEC) 20 MG capsule Take 20 mg by mouth 2 (two) times daily before a meal.    . rOPINIRole (REQUIP) 1 MG tablet Take 1 tablet (1 mg total) by mouth 2 (two) times daily.     No current facility-administered medications for this visit.    OBJECTIVE:  Filed Vitals:   10/17/14 1432  BP: 136/70  Pulse: 99  Temp: 97.4 F (36.3 C)     Body mass index is 17.17 kg/(m^2).    ECOG FS:3 - Symptomatic, >50% confined to bed  PHYSICAL EXAM: GENERAL status: Patient is in wheelchair. Lymphatic system: No palpable  supraclavicular cervical axillary inguinal adenopathy Examination of the chest was unremarkable. There were no bony deformities, no asymmetry, and no other abnormalities. Cardiac: Tachycardia. Abdominal exam revealed normal bowel sounds. The abdomen was soft, non-tender, and without masses, organomegaly, or appreciable enlargement of the abdominal aorta. Examination of the skin revealed no evidence of significant rashes, suspicious appearing nevi or other concerning lesions. Lower extremity no edema Neurological system: Higher functions within normal limit.  Cranial nerves are intact. Lower extremity motor and sensory system difficult to evaluate. I System: Patient is somewhat depressed   LAB RESULTS:  No visits with results within 3 Day(s) from this visit. Latest known visit with results is:  Appointment on 10/13/2014  Component Date Value Ref Range Status  . CA 27.29 10/13/2014 713.2* 0.0 - 38.6 U/mL Final   Comment: (NOTE) Specimen was diluted in order to obtain results. Results were repeated. Research scientist (life sciences) Performed At: [facility] [place], [place] [id number] Lindon Romp MD QZ:[id number]   . Sodium 10/13/2014 133* 135 - 145 mmol/L Final  . Potassium 10/13/2014 3.8  3.5 - 5.1 mmol/L Final  . Chloride 10/13/2014 102  101 - 111 mmol/L Final  . CO2 10/13/2014 22  22 - 32 mmol/L Final  . Glucose, Bld 10/13/2014 145* 65 - 99 mg/dL Final  . BUN 10/13/2014 29* 6 - 20 mg/dL Final  . Creatinine, Ser 10/13/2014 0.96  0.44 - 1.00 mg/dL Final  . Calcium 10/13/2014 8.1* 8.9 - 10.3 mg/dL Final  . Total Protein 10/13/2014 6.3* 6.5 - 8.1 g/dL Final  . Albumin 10/13/2014 3.1* 3.5 - 5.0 g/dL Final  . AST 10/13/2014 22  15 - 41 U/L Final  . ALT 10/13/2014 14  14 - 54 U/L Final  . Alkaline Phosphatase 10/13/2014 63  38 - 126 U/L Final  . Total Bilirubin 10/13/2014 0.4  0.3 - 1.2 mg/dL Final  . GFR calc non Af Amer 10/13/2014 55* >60 mL/min Final    . GFR calc Af Amer 10/13/2014 >60  >60 mL/min Final   Comment: (NOTE) The eGFR has been calculated using the CKD EPI equation. This calculation has not been validated in all clinical situations. eGFR's persistently <60 mL/min signify possible Chronic Kidney Disease.   . Anion gap 10/13/2014 9  5 - 15 Final  . WBC 10/13/2014 3.0* 3.6 - 11.0 K/uL Final  . RBC 10/13/2014 3.04* 3.80 - 5.20 MIL/uL Final  . Hemoglobin 10/13/2014 9.8* 12.0 - 16.0 g/dL Final  . HCT 10/13/2014 28.9* 35.0 - 47.0 % Final  . MCV 10/13/2014 95.1  80.0 - 100.0 fL Final  . MCH 10/13/2014 32.1  26.0 - 34.0 pg Final  . MCHC 10/13/2014 33.8  32.0 - 36.0 g/dL Final  . RDW 10/13/2014 27.0* 11.5 - 14.5 % Final  . Platelets 10/13/2014 101* 150 - 440 K/uL Final  . Neutrophils Relative % 10/13/2014 68   Final  . Neutro Abs 10/13/2014 2.1  1.4 - 6.5 K/uL Final  . Lymphocytes Relative 10/13/2014 25   Final  . Lymphs Abs 10/13/2014 0.8* 1.0 - 3.6 K/uL Final  . Monocytes Relative 10/13/2014 7   Final  . Monocytes Absolute 10/13/2014 0.2  0.2 - 0.9 K/uL Final  . Eosinophils Relative 10/13/2014 0   Final  . Eosinophils Absolute 10/13/2014 0.0  0 - 0.7 K/uL Final  . Basophils Relative 10/13/2014 0   Final  . Basophils Absolute 10/13/2014 0.0  0 - 0.1 K/uL Final    Lab Results  Component Value Date   LABCA2 713.2* 10/13/2014   No results found for: CA199 No results found for: CEA No results found for: PSA No results found for: CA125   STUDIES: Ct Head Wo Contrast  10/08/2014   CLINICAL DATA:  Code stroke, slurred speech, history of metastatic breast cancer  EXAM: CT HEAD WITHOUT CONTRAST  TECHNIQUE: Contiguous axial images were obtained from the base of the skull through the vertex without intravenous contrast.  COMPARISON:  None.  FINDINGS: Hypodensity in the subcortical left frontal lobe (series 2/image 10), measuring 3.5 x 3.8 cm. While ischemic stroke is possible, the appearance is worrisome for vasogenic edema related  to metastasis.  No evidence of parenchymal  hemorrhage or extra-axial fluid collection.  No midline shift.  No CT evidence of acute infarction.  Subcortical white matter and periventricular small vessel ischemic changes.  Cerebral volume is within normal limits.  No ventriculomegaly.  The visualized paranasal sinuses are essentially clear. The mastoid air cells are unopacified.  No fracture or dislocation is seen.  IMPRESSION: Subcortical hypodensity in the left frontal lobe, measuring 3.5 x 3.8 cm, worrisome for metastasis, less likely acute ischemic stroke.  Consider MRI brain with contrast for further evaluation as clinically warranted.  These results were called by telephone at the time of interpretation on 10/08/2014 at 3:31 pm to Dr. [name] , who verbally acknowledged these results.   Electronically Signed   By: Julian Hy M.D.   On: 10/08/2014 15:34   Ct Chest W Contrast  10/09/2014   CLINICAL DATA:  Breast cancer.  Weakness.  Skeletal metastasis.  EXAM: CT CHEST, ABDOMEN, AND PELVIS WITH CONTRAST  TECHNIQUE: Multidetector CT imaging of the chest, abdomen and pelvis was performed following the standard protocol during bolus administration of intravenous contrast.  CONTRAST:  41m OMNIPAQUE IOHEXOL 300 MG/ML  SOLN  COMPARISON:  None.  FINDINGS: CT CHEST FINDINGS  Mediastinum/Nodes: No axillary or supraclavicular lymphadenopathy. No mediastinal hilar lymphadenopathy. No central pulmonary embolism. No pericardial fluid. Esophagus normal.  Lungs/Pleura: Subpleural nodule in the right middle lobe measures 2 mm (image 37, series 3.  CT ABDOMEN AND PELVIS FINDINGS  Hepatobiliary: Hyper enhancing lesion in the subcapsular right hepatic lobe measures 15 mm on image 56, series 2. A second lesion in the right hepatic lobe measures 10 mm on image 54. There are 2 hypervascular lesions in the left hepatic lobe on image 56 and 51. These lesions persists on the delayed imaging suggesting hemangioma. No biliary  duct dilatation. Gallbladder is normal.  Pancreas: Pancreas is normal. No ductal dilatation. No pancreatic inflammation.  Spleen: Normal spleen  Adrenals/urinary tract: Adrenal glands and kidneys are normal. The ureters and bladder normal.  Stomach/Bowel: Stomach, small bowel, appendix, and cecum are normal. The colon and rectosigmoid colon are normal. There are diverticula sigmoid colon without acute inflammation.  Vascular/Lymphatic: Abdominal aorta is normal caliber. There is no retroperitoneal or periportal lymphadenopathy. No pelvic lymphadenopathy.  Reproductive: Post hysterectomy anatomy.  No adnexal abnormality.  Musculoskeletal: There multiple sclerotic lesions throughout the pelvis sacrum. Multiple sclerotic lesions throughout the entirety of the cervical, thoracic and lumbar spine. No pathologic fracture addendum  Other: No free fluid.  IMPRESSION: Chest :  1. No evidence of pulmonary metastasis or adenopathy. 2. Small subpleural nodule in the right middle lobe is likely benign.  Abdomen / Pelvis Impression:  1. No evidence of soft tissue metastasis within the abdomen pelvis. 2. Hypervascular lesions within the liver are likely benign flash filling hemangiomas. Further characterization could be achieved with abdominal MRI with without contrast. 3. Widespread sclerotic skeletal metastasis within the spine and pelvis.   Electronically Signed   By: SSuzy BouchardM.D.   On: 10/09/2014 01:33   Mr BJeri CosWMVContrast  10/08/2014   CLINICAL DATA:  Speech disturbance noted early this afternoon, now resolved. LEFT-sided weakness, worse than generalized weakness for the last 2 months. History of breast cancer, diffuse metastasis.  EXAM: MRI HEAD WITHOUT CONTRAST  TECHNIQUE: Multiplanar, multiecho pulse sequences of the brain and surrounding structures were obtained without intravenous contrast.  COMPARISON:  CT of the head Oct 08, 2014 at 1519 hours and head CT August 16, 2014  FINDINGS: Low T2,  intermediate to  low T1 homogeneously enhancing 2 x 2.4 cm mass LEFT inferior frontal lobe, with slight apparent dural tail. Surrounding T2 bright vasogenic edema and local sulcal effacement, without midline shift. No additional suspicious intracranial enhancement.  Multiple subcentimeter foci of reduced diffusion bilateral inferior cerebellum, with corresponding low ADC values. Similar subcentimeter foci and posterior frontal lobes and parietal lobes bilaterally. Punctate focus of susceptibility artifact in LEFT frontal lobe. The ventricles and sulci are normal for patient's age. Scattered subcentimeter supratentorial white matter FLAIR T2 hyperintensities exclusive of the aforementioned foci of acute ischemia. Tiny bilateral basal ganglia perivascular spaces, unlikely to reflect the lacunar infarcts.  No abnormal extra-axial fluid collections. Normal major intracranial vascular flow voids seen at the skull base. Scattered small mucosal retention cyst without paranasal sinus air-fluid levels. Minimal RIGHT mastoid effusion. Ocular globes and orbital contents appear normal though not tailored for evaluation. No abnormal sellar expansion. No cerebellar tonsillar ectopia. Abnormal low T1 signal in the LEFT frontal calvarium without enhancement, unlikely to reflect metastasis.  IMPRESSION: 2 x 2.4 cm LEFT inferior frontal lobe mass with imaging characteristics of meningioma, less likely dural metastasis with surrounding vasogenic edema.  Multiple acute bifrontal, biparietal and bilateral cerebellar subcentimeter infarcts likely representing embolic phenomena.   Electronically Signed   By: Elon Alas   On: 10/08/2014 22:36   Ct Abdomen Pelvis W Contrast  10/09/2014   CLINICAL DATA:  Breast cancer.  Weakness.  Skeletal metastasis.  EXAM: CT CHEST, ABDOMEN, AND PELVIS WITH CONTRAST  TECHNIQUE: Multidetector CT imaging of the chest, abdomen and pelvis was performed following the standard protocol during bolus administration of  intravenous contrast.  CONTRAST:  76m OMNIPAQUE IOHEXOL 300 MG/ML  SOLN  COMPARISON:  None.  FINDINGS: CT CHEST FINDINGS  Mediastinum/Nodes: No axillary or supraclavicular lymphadenopathy. No mediastinal hilar lymphadenopathy. No central pulmonary embolism. No pericardial fluid. Esophagus normal.  Lungs/Pleura: Subpleural nodule in the right middle lobe measures 2 mm (image 37, series 3.  CT ABDOMEN AND PELVIS FINDINGS  Hepatobiliary: Hyper enhancing lesion in the subcapsular right hepatic lobe measures 15 mm on image 56, series 2. A second lesion in the right hepatic lobe measures 10 mm on image 54. There are 2 hypervascular lesions in the left hepatic lobe on image 56 and 51. These lesions persists on the delayed imaging suggesting hemangioma. No biliary duct dilatation. Gallbladder is normal.  Pancreas: Pancreas is normal. No ductal dilatation. No pancreatic inflammation.  Spleen: Normal spleen  Adrenals/urinary tract: Adrenal glands and kidneys are normal. The ureters and bladder normal.  Stomach/Bowel: Stomach, small bowel, appendix, and cecum are normal. The colon and rectosigmoid colon are normal. There are diverticula sigmoid colon without acute inflammation.  Vascular/Lymphatic: Abdominal aorta is normal caliber. There is no retroperitoneal or periportal lymphadenopathy. No pelvic lymphadenopathy.  Reproductive: Post hysterectomy anatomy.  No adnexal abnormality.  Musculoskeletal: There multiple sclerotic lesions throughout the pelvis sacrum. Multiple sclerotic lesions throughout the entirety of the cervical, thoracic and lumbar spine. No pathologic fracture addendum  Other: No free fluid.  IMPRESSION: Chest :  1. No evidence of pulmonary metastasis or adenopathy. 2. Small subpleural nodule in the right middle lobe is likely benign.  Abdomen / Pelvis Impression:  1. No evidence of soft tissue metastasis within the abdomen pelvis. 2. Hypervascular lesions within the liver are likely benign flash filling  hemangiomas. Further characterization could be achieved with abdominal MRI with without contrast. 3. Widespread sclerotic skeletal metastasis within the spine and pelvis.   Electronically Signed  By: Suzy Bouchard M.D.   On: 10/09/2014 01:33   Dg Chest Portable 1 View  10/08/2014   CLINICAL DATA:  Confusion today. History of metastatic breast cancer. Initial encounter.  EXAM: PORTABLE CHEST - 1 VIEW  COMPARISON:  Radiographs 08/02/2009. Chest CT 08/03/2014 and PET-CT 08/16/2014.  FINDINGS: 1545 hour. The heart size and mediastinal contours are stable. The lungs are clear. There is no pleural effusion or pneumothorax. The known osseous metastases are not well visualized. Telemetry leads overlie the chest. Patient is status post left mastectomy.  IMPRESSION: No active cardiopulmonary process.   Electronically Signed   By: Richardean Sale M.D.   On: 10/08/2014 16:05    ASSESSMENT: Stage IV carcinoma of breast metastases to the bone Progressing disease based on tumor markers and PET scan 2.  Multiple small infarcts on S MRI scan of brain Complete cardiac workup for atrial fibrillation has been negative.  Echocardiogram did not reveal any atria of blood clots.   MEDICAL DECISION MAKING:  Scan has been reviewed independently. All available MRI scan from the [place] Has been reviewed I had been constant discussion with hospitalization [facility]  I had long discussion regarding possibility of chemotherapy as well as palliative options. Patient has failed all antiviral hormonal therapy.Total duration of visit was 50 minutes.  50% or more time was spent in counseling patient and family regarding prognosis and options of treatment and available resources [place] and family's will discuss all these options and would call me with their decision  Patient expressed understanding and was in agreement with this plan. She also understands that She can call clinic at any time with any questions,  concerns, or complaints.    No matching staging information was found for the patient.  Forest Gleason, MD   11/05/2014 8:37 AM

## 2014-11-08 ENCOUNTER — Ambulatory Visit: Payer: Self-pay | Admitting: Oncology

## 2014-11-08 ENCOUNTER — Other Ambulatory Visit: Payer: Self-pay

## 2014-11-23 ENCOUNTER — Other Ambulatory Visit: Payer: Self-pay | Admitting: *Deleted

## 2014-11-23 DIAGNOSIS — C50919 Malignant neoplasm of unspecified site of unspecified female breast: Secondary | ICD-10-CM

## 2014-11-24 ENCOUNTER — Telehealth: Payer: Self-pay | Admitting: *Deleted

## 2014-11-24 NOTE — Telephone Encounter (Signed)
Notified that pt has appt Monday and this can be discussed at that appt per L Herring, AGNP-C

## 2014-11-27 ENCOUNTER — Other Ambulatory Visit: Payer: Medicare Other

## 2014-11-27 ENCOUNTER — Ambulatory Visit: Payer: Medicare Other | Admitting: Oncology

## 2014-11-27 ENCOUNTER — Telehealth: Payer: Self-pay | Admitting: *Deleted

## 2014-11-27 NOTE — Telephone Encounter (Signed)
Per Dr Oliva Bustard, Decadron 4 mg daily and keep the prn order for nausea. Erline Levine notified

## 2014-12-18 ENCOUNTER — Telehealth: Payer: Self-pay | Admitting: *Deleted

## 2014-12-18 NOTE — Telephone Encounter (Signed)
Gave verbal ok to insert foley

## 2015-01-08 DEATH — deceased

## 2016-05-10 IMAGING — CT CT ABD-PELV W/ CM
2 of 5 series · 13 of 36 positions shown, 16 images · IV contrast (APPLIED)
Comparison: None.

CLINICAL DATA: Breast cancer.  Weakness.  Skeletal metastasis.

EXAM:
CT CHEST, ABDOMEN, AND PELVIS WITH CONTRAST
TECHNIQUE: Multidetector CT imaging of the chest, abdomen and pelvis was
performed following the standard protocol during bolus
administration of intravenous contrast.
CONTRAST:  70mL OMNIPAQUE IOHEXOL 300 MG/ML  SOLN

[Series 2: cap 5.0 i31f 1 · axial · 0.70mm/px · z∈[+763,+1278]mm · 10 of 119 slices shown, 13 images]
[im 8/119  mediastinal]
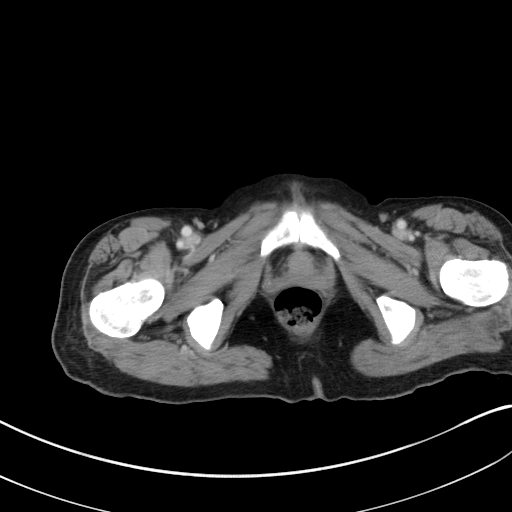
[im 8/119  lung]
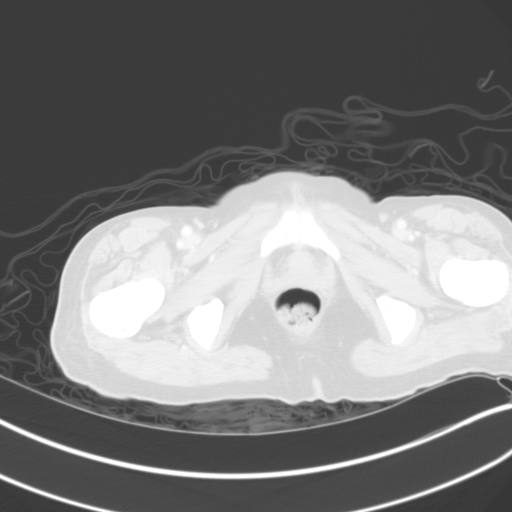
[im 24/119  lung]
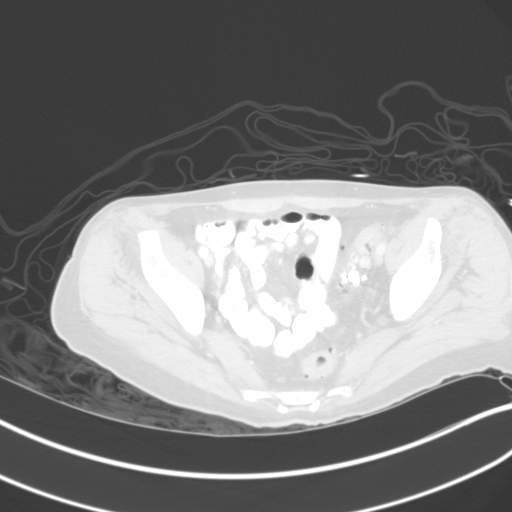
[im 32/119  lung]
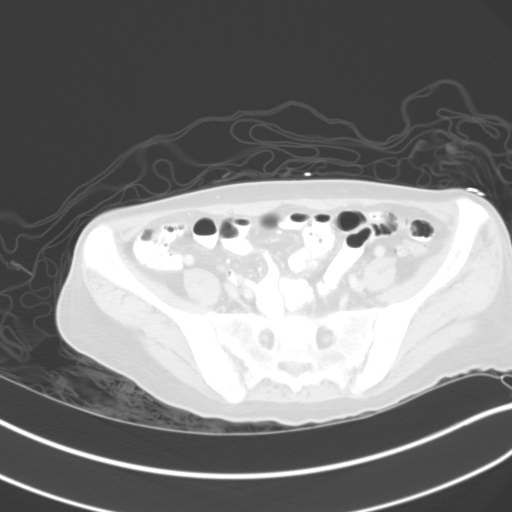
[im 40/119  lung]
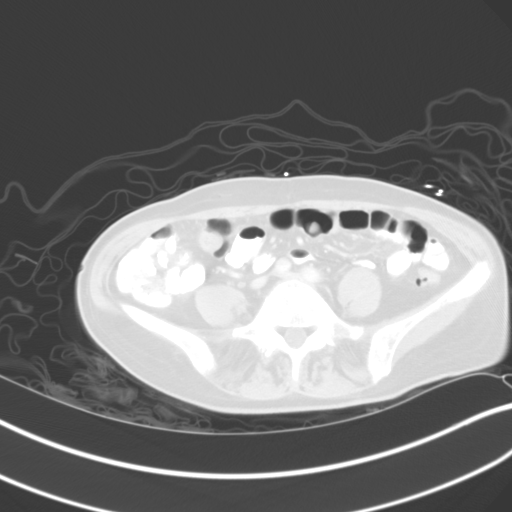
[im 56/119  mediastinal]
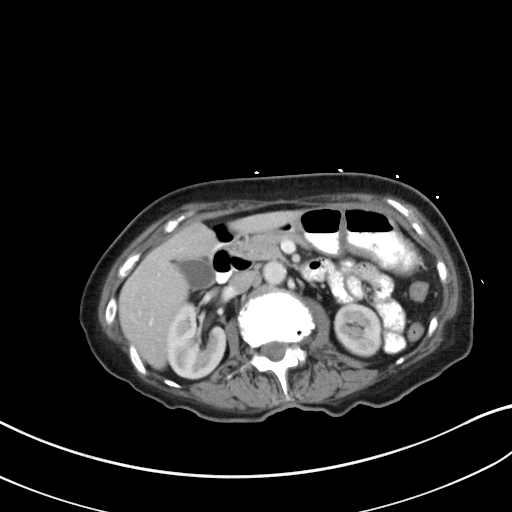
[im 56/119  lung]
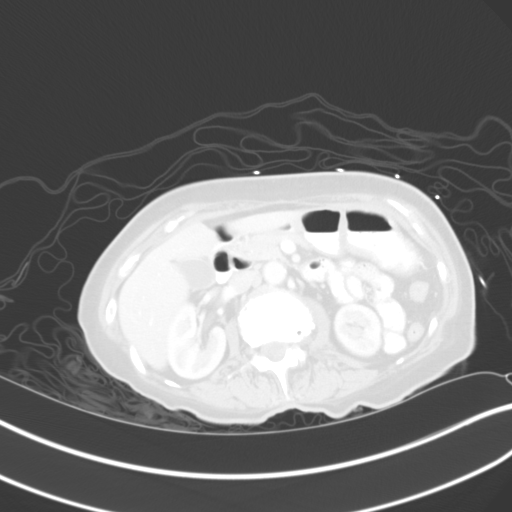
[im 63/119  lung]
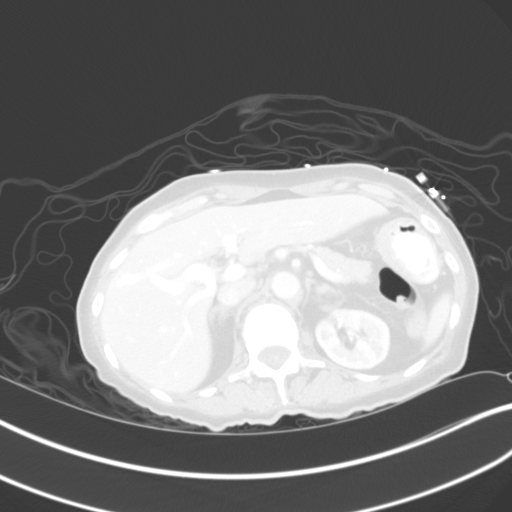
[im 79/119  lung]
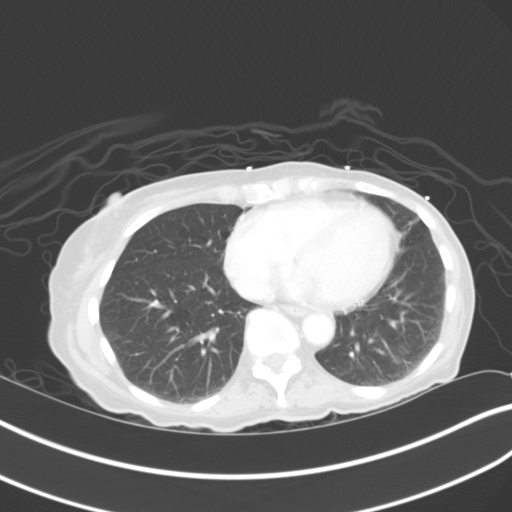
[im 87/119  lung]
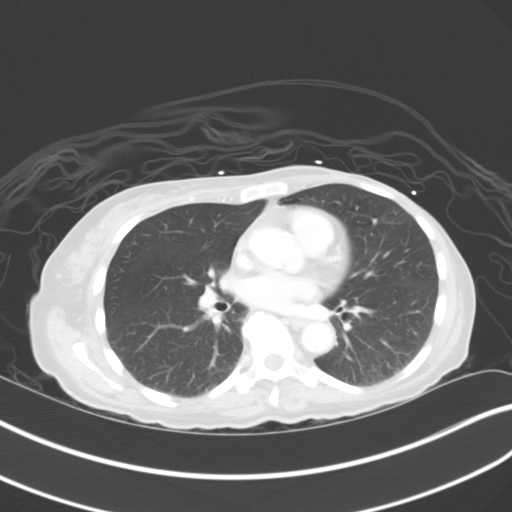
[im 95/119  mediastinal]
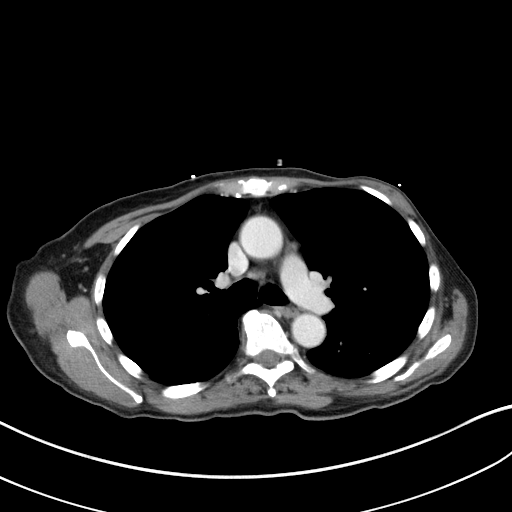
[im 95/119  lung]
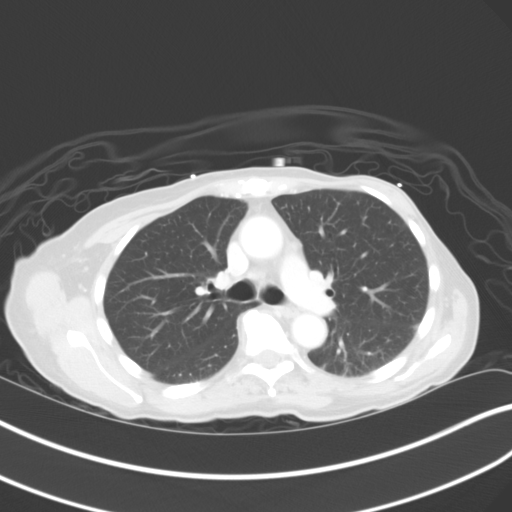
[im 111/119  lung]
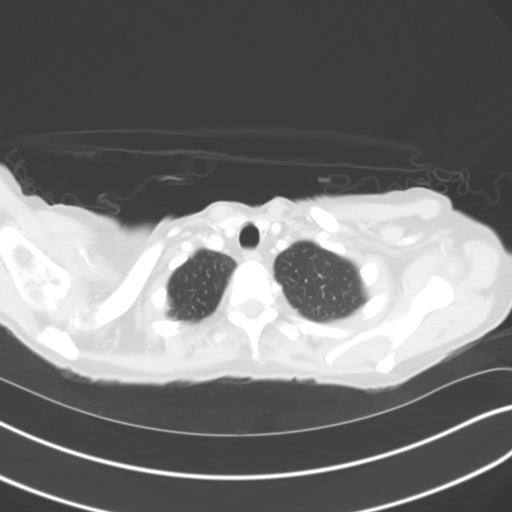

[Series 5: coronal · coronal · 0.59mm/px · 3 of 56 slices shown]
[im 12/56  lung]
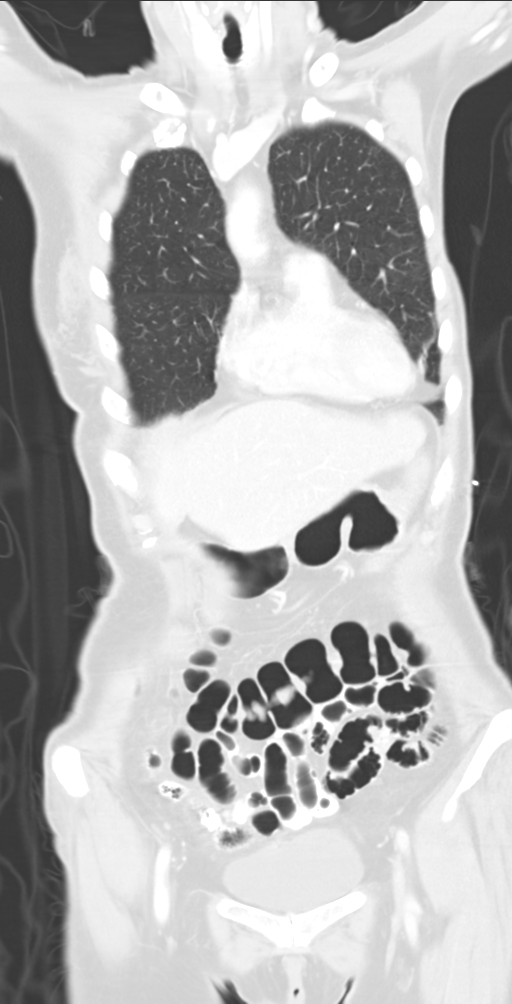
[im 23/56  lung]
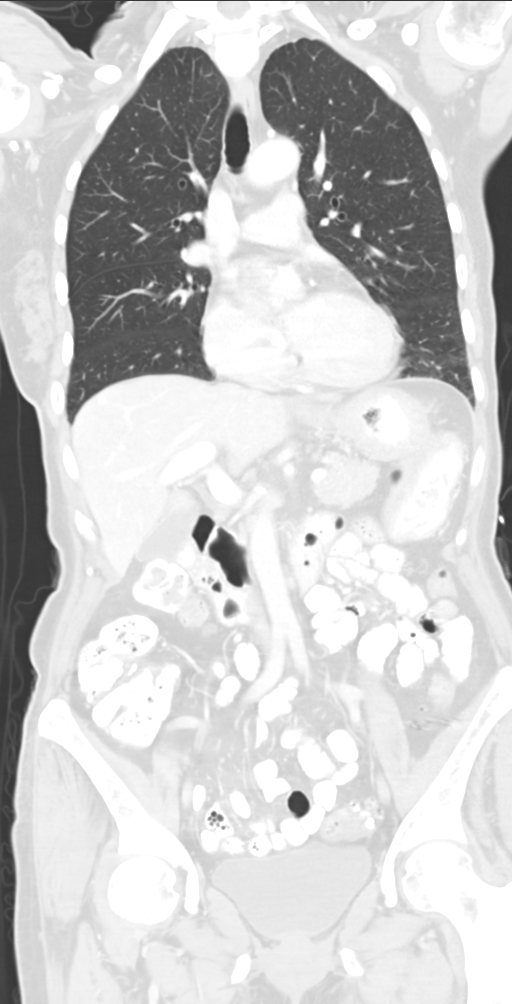
[im 34/56  lung]
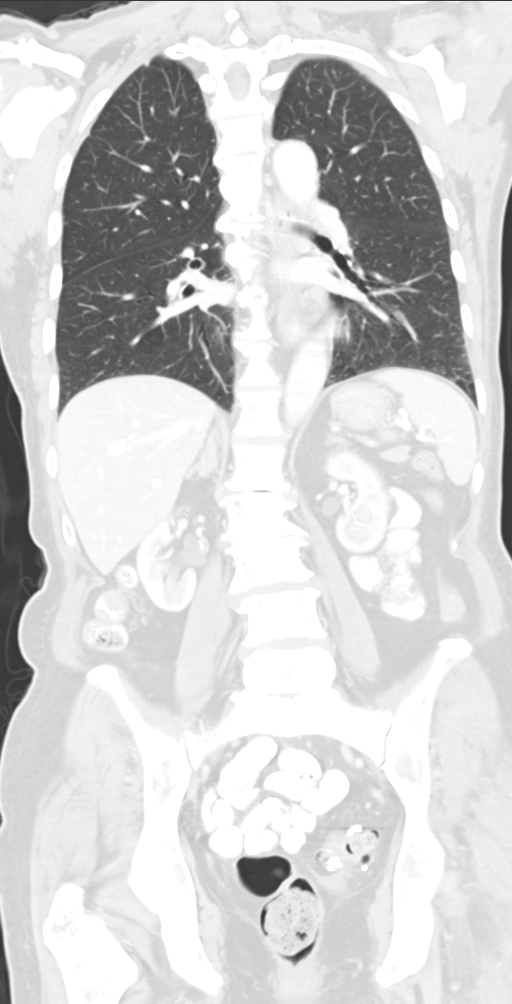

[13 of 36 positions shown; findings below may reference images not displayed]

FINDINGS: CT CHEST FINDINGS

Mediastinum/Nodes: No axillary or supraclavicular lymphadenopathy.
No mediastinal hilar lymphadenopathy. No central pulmonary embolism.
No pericardial fluid. Esophagus normal.

Lungs/Pleura: Subpleural nodule in the right middle lobe measures 2
mm (image 37, series 3.

CT ABDOMEN AND PELVIS FINDINGS

Hepatobiliary: Hyper enhancing lesion in the subcapsular right
hepatic lobe measures 15 mm on image 56, series 2. A second lesion
in the right hepatic lobe measures 10 mm on image 54. There are 2
hypervascular lesions in the left hepatic lobe on image 56 and 51.
These lesions persists on the delayed imaging suggesting hemangioma.
No biliary duct dilatation. Gallbladder is normal.

Pancreas: Pancreas is normal. No ductal dilatation. No pancreatic
inflammation.

Spleen: Normal spleen

Adrenals/urinary tract: Adrenal glands and kidneys are normal. The
ureters and bladder normal.

Stomach/Bowel: Stomach, small bowel, appendix, and cecum are normal.
The colon and rectosigmoid colon are normal. There are diverticula
sigmoid colon without acute inflammation.

Vascular/Lymphatic: Abdominal aorta is normal caliber. There is no
retroperitoneal or periportal lymphadenopathy. No pelvic
lymphadenopathy.

Reproductive: Post hysterectomy anatomy.  No adnexal abnormality.

Musculoskeletal: There multiple sclerotic lesions throughout the
pelvis sacrum. Multiple sclerotic lesions throughout the entirety of
the cervical, thoracic and lumbar spine. No pathologic fracture
addendum

Other: No free fluid.
IMPRESSION: Chest :

1. No evidence of pulmonary metastasis or adenopathy.
2. Small subpleural nodule in the right middle lobe is likely
benign.

Abdomen / Pelvis Impression:

1. No evidence of soft tissue metastasis within the abdomen pelvis.
2. Hypervascular lesions within the liver are likely benign flash
filling hemangiomas. Further characterization could be achieved with
abdominal MRI with without contrast.
3. Widespread sclerotic skeletal metastasis within the spine and
pelvis.
